# Patient Record
Sex: Female | Born: 1950
Health system: Southern US, Community
[De-identification: ages and names within clinical notes are randomized; demographics above are authoritative.]

## PROBLEM LIST (undated history)

## (undated) DIAGNOSIS — N6099 Unspecified benign mammary dysplasia of unspecified breast: Principal | ICD-10-CM

## (undated) DIAGNOSIS — S83209A Unspecified tear of unspecified meniscus, current injury, unspecified knee, initial encounter: Secondary | ICD-10-CM

## (undated) DIAGNOSIS — F32A Depression, unspecified: Secondary | ICD-10-CM

## (undated) DIAGNOSIS — Z8489 Family history of other specified conditions: Secondary | ICD-10-CM

## (undated) DIAGNOSIS — F329 Major depressive disorder, single episode, unspecified: Secondary | ICD-10-CM

## (undated) DIAGNOSIS — G4730 Sleep apnea, unspecified: Secondary | ICD-10-CM

## (undated) DIAGNOSIS — I10 Essential (primary) hypertension: Secondary | ICD-10-CM

## (undated) HISTORY — DX: Unspecified benign mammary dysplasia of unspecified breast: N60.99

## (undated) HISTORY — PX: NEVUS EXCISION: SHX2090

## (undated) HISTORY — PX: WISDOM TOOTH EXTRACTION: SHX21

## (undated) HISTORY — DX: Unspecified tear of unspecified meniscus, current injury, unspecified knee, initial encounter: S83.209A

---
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
## 1998-01-18 ENCOUNTER — Other Ambulatory Visit: Admission: RE | Admit: 1998-01-18 | Discharge: 1998-01-18 | Payer: Self-pay | Admitting: *Deleted

## 1999-04-24 ENCOUNTER — Other Ambulatory Visit: Admission: RE | Admit: 1999-04-24 | Discharge: 1999-04-24 | Payer: Self-pay | Admitting: *Deleted

## 2001-10-24 ENCOUNTER — Other Ambulatory Visit: Admission: RE | Admit: 2001-10-24 | Discharge: 2001-10-24 | Payer: Self-pay | Admitting: Family Medicine

## 2002-02-12 HISTORY — PX: FINGER SURGERY: SHX640

## 2002-06-02 ENCOUNTER — Encounter: Admission: RE | Admit: 2002-06-02 | Discharge: 2002-08-31 | Payer: Self-pay | Admitting: Family Medicine

## 2003-05-27 ENCOUNTER — Ambulatory Visit (HOSPITAL_BASED_OUTPATIENT_CLINIC_OR_DEPARTMENT_OTHER): Admission: RE | Admit: 2003-05-27 | Discharge: 2003-05-27 | Payer: Self-pay | Admitting: Orthopedic Surgery

## 2003-05-27 ENCOUNTER — Ambulatory Visit (HOSPITAL_COMMUNITY): Admission: RE | Admit: 2003-05-27 | Discharge: 2003-05-27 | Payer: Self-pay | Admitting: Orthopedic Surgery

## 2003-05-27 ENCOUNTER — Encounter (INDEPENDENT_AMBULATORY_CARE_PROVIDER_SITE_OTHER): Payer: Self-pay | Admitting: *Deleted

## 2007-10-18 ENCOUNTER — Emergency Department (HOSPITAL_BASED_OUTPATIENT_CLINIC_OR_DEPARTMENT_OTHER): Admission: EM | Admit: 2007-10-18 | Discharge: 2007-10-18 | Payer: Self-pay | Admitting: Emergency Medicine

## 2007-10-21 ENCOUNTER — Ambulatory Visit (HOSPITAL_BASED_OUTPATIENT_CLINIC_OR_DEPARTMENT_OTHER): Admission: RE | Admit: 2007-10-21 | Discharge: 2007-10-21 | Payer: Self-pay | Admitting: Emergency Medicine

## 2008-05-28 ENCOUNTER — Ambulatory Visit (HOSPITAL_COMMUNITY): Admission: RE | Admit: 2008-05-28 | Discharge: 2008-05-28 | Payer: Self-pay | Admitting: Obstetrics

## 2008-05-28 ENCOUNTER — Encounter: Payer: Self-pay | Admitting: Obstetrics

## 2008-10-31 ENCOUNTER — Ambulatory Visit: Payer: Self-pay | Admitting: Radiology

## 2008-10-31 ENCOUNTER — Emergency Department (HOSPITAL_BASED_OUTPATIENT_CLINIC_OR_DEPARTMENT_OTHER): Admission: EM | Admit: 2008-10-31 | Discharge: 2008-11-01 | Payer: Self-pay | Admitting: Emergency Medicine

## 2009-12-09 IMAGING — CT CT HEAD W/O CM
1 series · 16 of 30 positions shown, 20 images · non-contrast
Comparison: None available.

CLINICAL DATA: Dizzy.  Disoriented.  Posterior neck pain.

CT HEAD WITHOUT CONTRAST
TECHNIQUE: Contiguous axial images were obtained from the base of
the skull through the vertex without contrast.

[Series 2: head 4.8 h37s · axial · 0.46mm/px · z∈[-142,+1]mm · 16 of 32 slices shown, 20 images]
[im 2/32  brain]
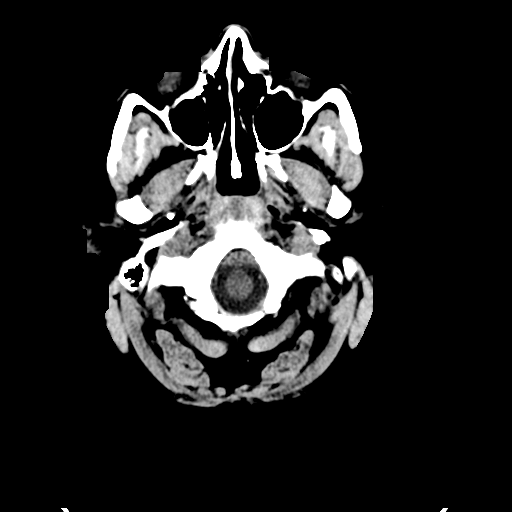
[im 2/32  bone]
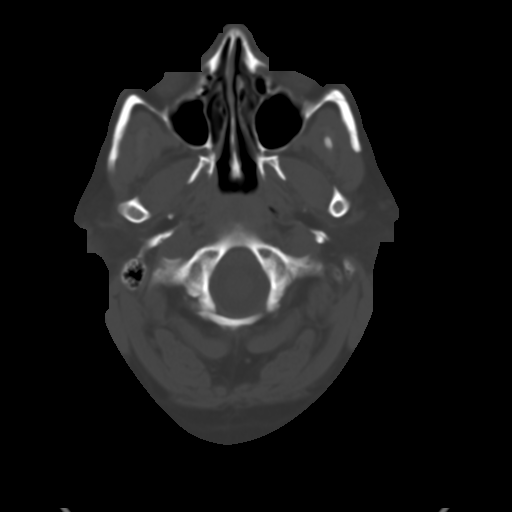
[im 4/32  brain]
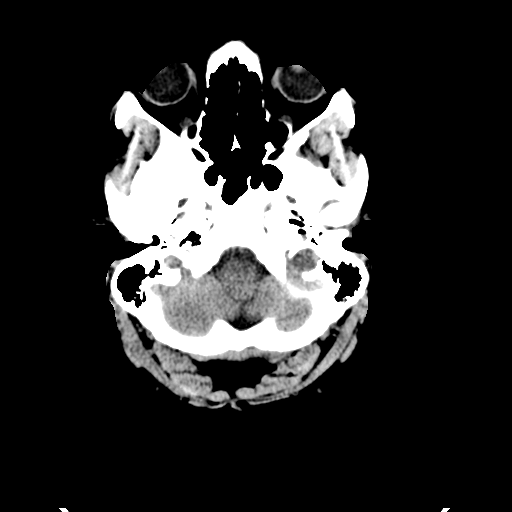
[im 6/32  brain]
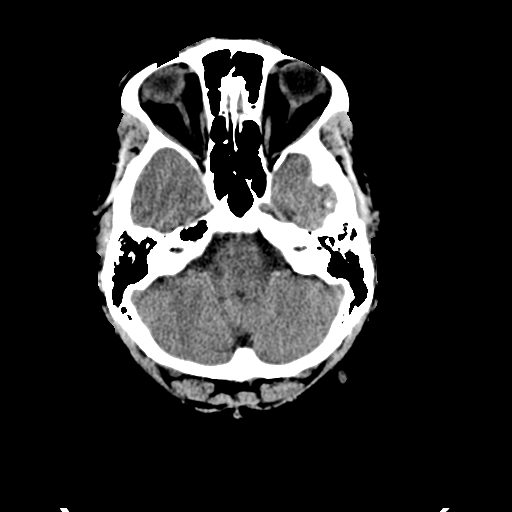
[im 8/32  brain]
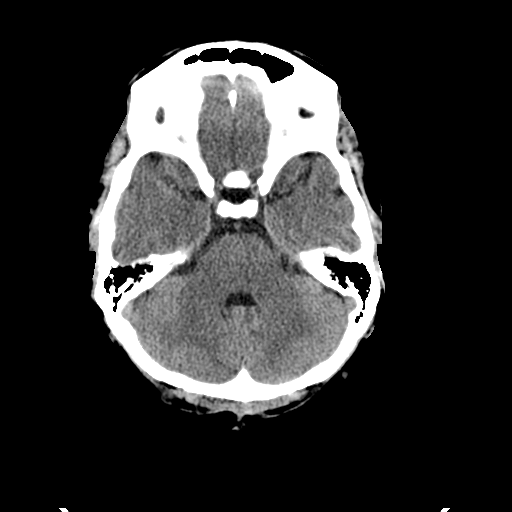
[im 9/32  brain]
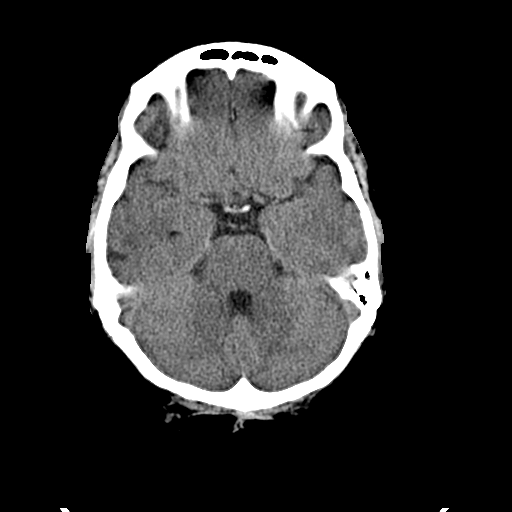
[im 9/32  bone]
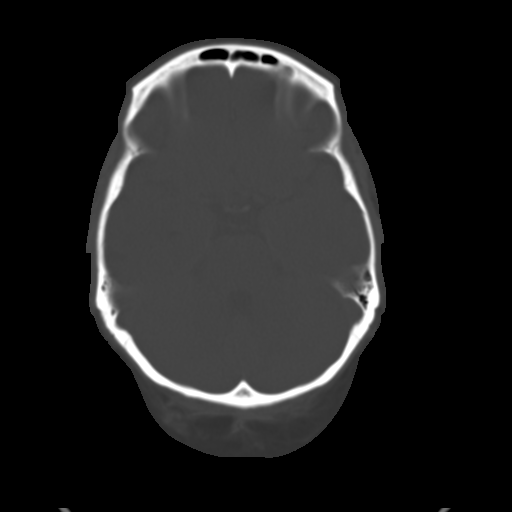
[im 11/32  brain]
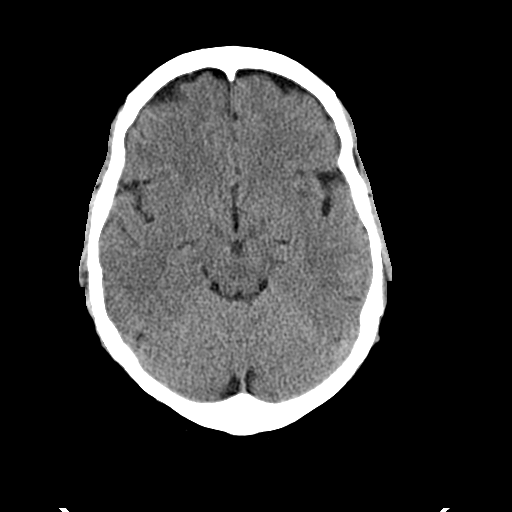
[im 13/32  brain]
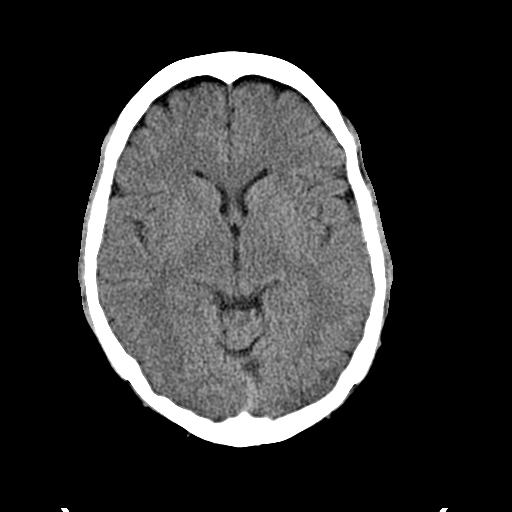
[im 15/32  brain]
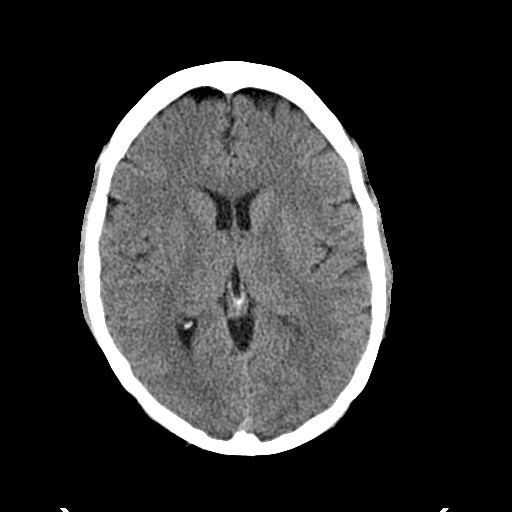
[im 17/32  brain]
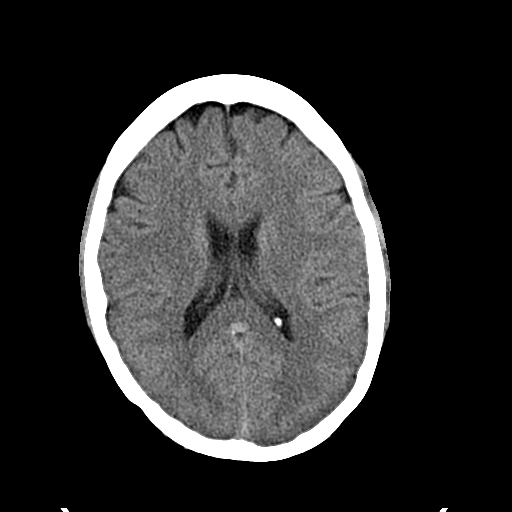
[im 17/32  bone]
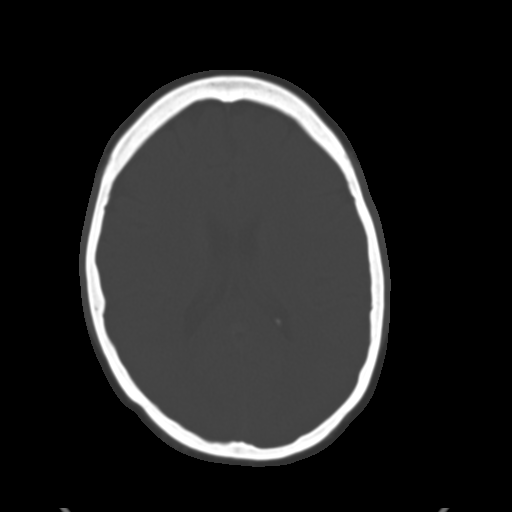
[im 19/32  brain]
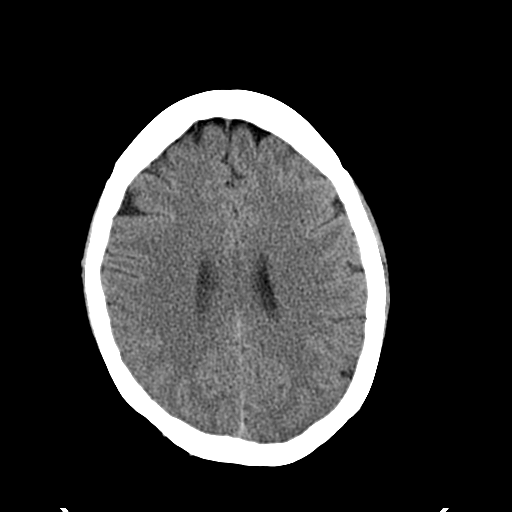
[im 21/32  brain]
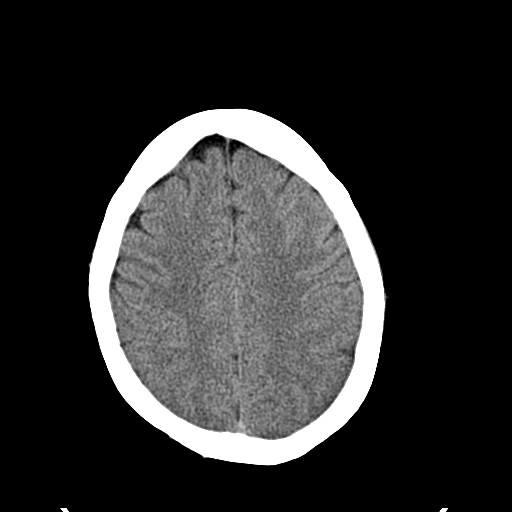
[im 23/32  brain]
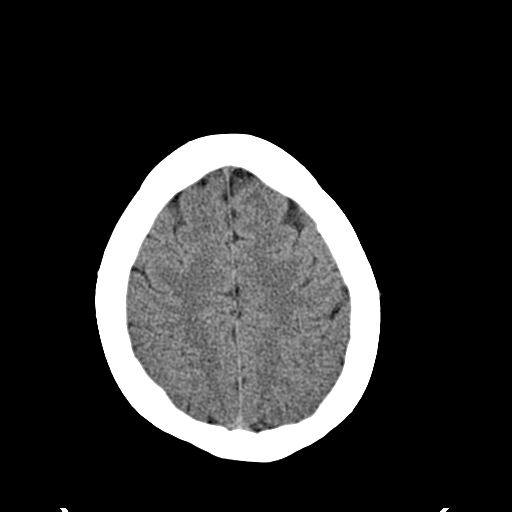
[im 24/32  brain]
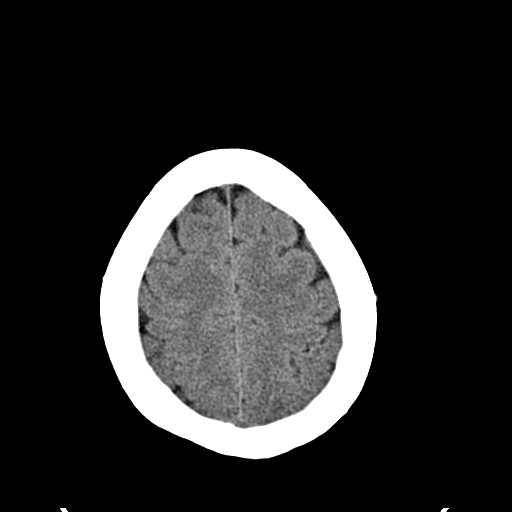
[im 24/32  bone]
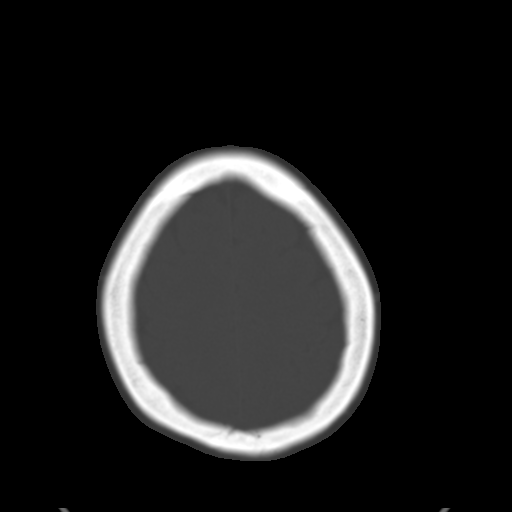
[im 26/32  brain]
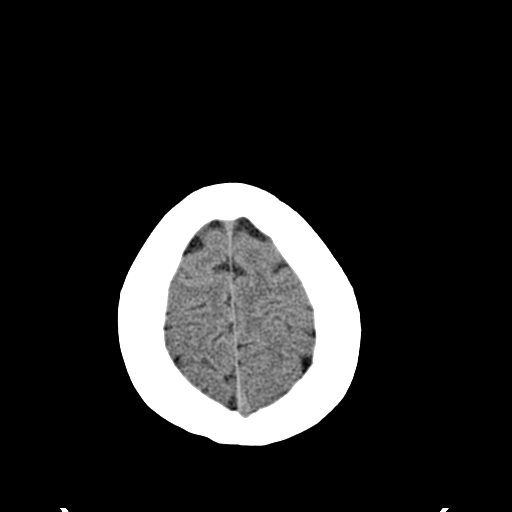
[im 28/32  brain]
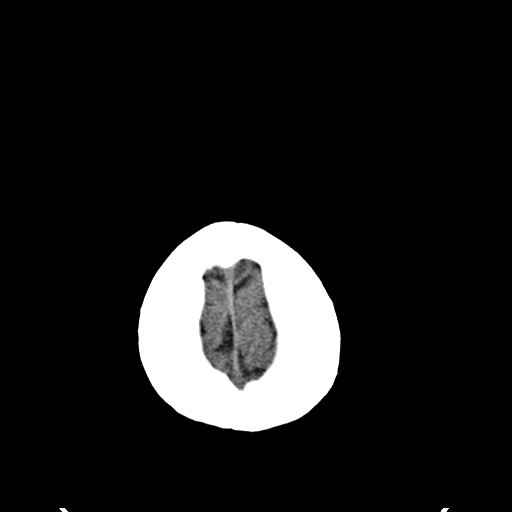
[im 30/32  brain]
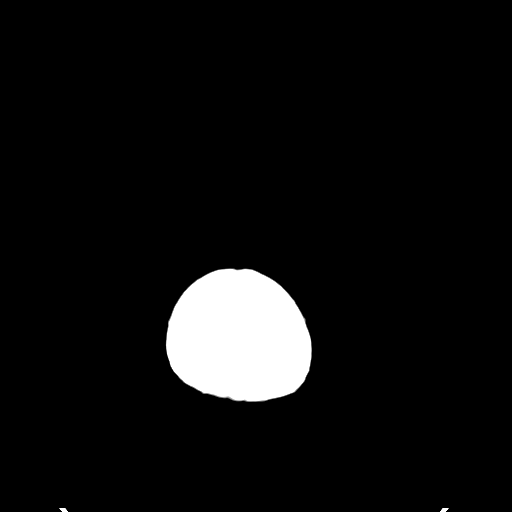

[16 of 30 positions shown; findings below may reference images not displayed]

FINDINGS: No acute intracranial abnormality is present.
Specifically, there is no evidence for acute infarct, hemorrhage,
mass, hydrocephalus, or extra-axial fluid collection.  The
paranasal sinuses and mastoid air cells are clear.  The globes and
orbits are intact.  The osseous skull is intact.
IMPRESSION: No acute intracranial abnormality.

## 2010-05-19 LAB — CBC
Hemoglobin: 13.9 g/dL (ref 12.0–15.0)
Platelets: 217 10*3/uL (ref 150–400)
RDW: 11.8 % (ref 11.5–15.5)
WBC: 5.9 10*3/uL (ref 4.0–10.5)

## 2010-05-19 LAB — URINALYSIS, ROUTINE W REFLEX MICROSCOPIC
Bilirubin Urine: NEGATIVE
Glucose, UA: NEGATIVE mg/dL
Hgb urine dipstick: NEGATIVE
Ketones, ur: 15 mg/dL — AB
Nitrite: NEGATIVE
Specific Gravity, Urine: 1.009 (ref 1.005–1.030)
pH: 5.5 (ref 5.0–8.0)

## 2010-05-19 LAB — DIFFERENTIAL
Basophils Absolute: 0 10*3/uL (ref 0.0–0.1)
Lymphocytes Relative: 15 % (ref 12–46)
Lymphs Abs: 0.9 10*3/uL (ref 0.7–4.0)
Monocytes Absolute: 0.1 10*3/uL (ref 0.1–1.0)
Neutro Abs: 4.8 10*3/uL (ref 1.7–7.7)

## 2010-05-19 LAB — POCT CARDIAC MARKERS
CKMB, poc: <1 ng/mL — ABNORMAL LOW (ref 1.0–8.0)
Troponin i, poc: <0.05 ng/mL (ref 0.00–0.09)

## 2010-05-19 LAB — URINE MICROSCOPIC-ADD ON

## 2010-05-19 LAB — BASIC METABOLIC PANEL
Calcium: 8.9 mg/dL (ref 8.4–10.5)
GFR calc non Af Amer: >60 mL/min (ref >60)
Glucose, Bld: 146 mg/dL — ABNORMAL HIGH (ref 70–99)
Sodium: 139 mEq/L (ref 135–145)

## 2010-05-24 LAB — CBC
Platelets: 249 10*3/uL (ref 150–400)
RDW: 12.5 % (ref 11.5–15.5)

## 2010-05-24 LAB — GLUCOSE, CAPILLARY

## 2010-06-27 NOTE — Op Note (Signed)
[name], Monique Reynolds              ACCOUNT NO.:  [id number]   MEDICAL RECORD NO.:  [id number]          PATIENT TYPE:  AMB   LOCATION:  SDC                           FACILITY:  [facility]   PHYSICIAN:  Charles A. Clearance Coots, M.D.DATE OF BIRTH:  June 04, 1950   DATE OF PROCEDURE:  DATE OF DISCHARGE:                               OPERATIVE REPORT   PREOPERATIVE DIAGNOSIS:  Pigmented nevus, right vulvar mons pubis area.   POSTOPERATIVE DIAGNOSIS:  Pigmented nevus, right vulvar mons pubis area.   PROCEDURE:  Excision of pigmented nevus, right vulvar mons pubis   SURGEON:  Charles A. Clearance Coots, MD   ANESTHESIA:  General.   ESTIMATED BLOOD LOSS:  Minimal.   COMPLICATIONS:  None.   FINDINGS:  Pigmented nevus, right vulvar mons pubis area.   SPECIMEN:  Demented nevus.   DISPOSITION:  Specimen to Pathology.   OPERATION:  The patient was brought to the operating room, and after  satisfactory general endotracheal anesthesia, the external genitalia was  prepped and draped in an usual sterile fashion.  An elliptical incision  was made with a 11 scalpel, and the nevus was removed and submitted to  Pathology for evaluation.  The base was then closed with subcuticular  suture of 3-0 Vicryl.  Dermabond was applied to the closure.  There was  no active bleeding at the conclusion of the procedure.  The patient  tolerated the procedure well and transported to the recovery room in  satisfactory condition.      Charles A. Clearance Coots, M.D.  Electronically Signed     CAH/MEDQ  D:  05/28/2008  T:  05/28/2008  Job:  [id number]

## 2010-06-30 NOTE — Op Note (Signed)
Monique Reynolds, Monique Reynolds                          ACCOUNT NO.:  [id number]   MEDICAL RECORD NO.:  [id number]                   PATIENT TYPE:  AMB   LOCATION:  DSC                                  FACILITY:  [facility]   PHYSICIAN:  Katy Fitch. Naaman Plummer., M.D.          DATE OF BIRTH:  Mar 20, 1950   DATE OF PROCEDURE:  05/27/2003  DATE OF DISCHARGE:                                 OPERATIVE REPORT   PREOPERATIVE DIAGNOSIS:  Painful mass right ring finger at C1-A2 pulley.   POSTOPERATIVE DIAGNOSIS:  Exuberant synovitis, rule out inflammatory  tenosynovitis versus microbacterial infection or other atypical infection of  right ring finger flexor sheath.   OPERATION:  Exploration of right ring finger flexor sheath with synovectomy  of profundus tendons for synovial biopsy and synovial culture for acid fast  bacilli, microbacteria, and routine histopathology.   SURGEON:  Katy Fitch. Sypher, M.D.   ASSISTANT:  Annye Rusk, P.A.   ANESTHESIA:  MAC.   ANESTHESIOLOGIST:  Janetta Hora. Gelene Mink, M.D.   INDICATIONS FOR PROCEDURE:  Monique Reynolds is a 60 year old referred by Dr.  Catha Gosselin for evaluation and management of a painful mass in her right  ring finger.  Our initial impression is that this is probably a retinacular  ganglion.  She reported discomfort and pain with prehension.  We observed  the mass for one month.  It did not involute nor did it enlarge  significantly.  We recommended excisional biopsy for diagnosis.  She is  brought to the operating room at this time anticipating biopsy and possible  synovectomy.   PROCEDURE:  Monique Reynolds is brought to the operating room and placed on  supine position on the operating table.  Following light sedation, the right  arm was prepped with Betadine solution and sterilely draped.  A pneumatic  tourniquet was applied to the proximal brachium.  0.25% Marcaine and 2%  lidocaine were infiltrated at the metacarpal head level to obtain a digital  block.  When anesthesia was satisfactory, the procedure commenced with a  Brunner zig-zag incision exploring the flexor sheath from the proximal  margin of A4 to the distal portion of A2.  A very exuberant hyperplastic and  highly vascular synovitis was noted extruding between the A4 pulley and C1,  C1 and A3, and C1 and A2.  A rongeur was used to gently remove the  pathologic synovium and a biopsy specimen was placed in formalin for  histopathologic evaluation.  A complete synovectomy of the superficialis and  profundus tendons was accomplished at the level of the A4 through the A2  pulley obtaining a substantial amount of synovium for evaluation for AFB  smear and microbacterial culture.  The flexor tendons were moderately  fibular and necrotic deep to the tenosynovitis suggesting an infiltrative  process.  The wound was then irrigated and repaired with interrupted suture  of 5-0 nylon.  There were no apparent complications.  A provisional  diagnosis is an inflammatory tenosynovitis, either infectious or arthritic  in nature.  We will await the biopsy results.  There were no apparent  complications.   For aftercare, she was placed in a compressive dressing.  She was given  prescriptions for Vicodin 5 mg 1-2 tablets p.o. q.4-6h. p.r.n. pain and  Doxycycline 100 mg p.o. daily x 7 days.  She will return to our office for  follow up in 5-7 days to review her culture results, biopsy results and  wound examination.                                               Katy Fitch Naaman Plummer., M.D.    RVS/MEDQ  D:  05/27/2003  T:  05/27/2003  Job:  [id number]   cc:   Caryn Bee L. Little, M.D.  [address]  [place]  [place] [address]  Fax: [phone]

## 2010-11-15 LAB — URINALYSIS, ROUTINE W REFLEX MICROSCOPIC
Glucose, UA: NEGATIVE
Hgb urine dipstick: NEGATIVE
Protein, ur: NEGATIVE

## 2010-11-15 LAB — COMPREHENSIVE METABOLIC PANEL
ALT: 30
AST: 28
Albumin: 4.2
Alkaline Phosphatase: 66
BUN: 16
GFR calc Af Amer: >60
Potassium: 4.2
Sodium: 140
Total Protein: 7.1

## 2010-11-15 LAB — DIFFERENTIAL
Basophils Relative: 0
Monocytes Absolute: 0.5
Monocytes Relative: 5
Neutro Abs: 6

## 2010-11-15 LAB — CBC
Platelets: 236
RDW: 12.3

## 2011-07-05 ENCOUNTER — Other Ambulatory Visit: Payer: Self-pay | Admitting: Obstetrics

## 2011-09-10 ENCOUNTER — Other Ambulatory Visit: Payer: Self-pay | Admitting: Radiology

## 2011-10-04 ENCOUNTER — Ambulatory Visit (INDEPENDENT_AMBULATORY_CARE_PROVIDER_SITE_OTHER): Payer: BC Managed Care – PPO | Admitting: Surgery

## 2011-10-04 ENCOUNTER — Encounter (INDEPENDENT_AMBULATORY_CARE_PROVIDER_SITE_OTHER): Payer: Self-pay | Admitting: Surgery

## 2011-10-04 VITALS — BP 114/80 | HR 92 | Temp 98.4°F | Resp 12 | Ht 62.0 in | Wt 226.8 lb

## 2011-10-04 DIAGNOSIS — N6099 Unspecified benign mammary dysplasia of unspecified breast: Secondary | ICD-10-CM

## 2011-10-04 DIAGNOSIS — N6089 Other benign mammary dysplasias of unspecified breast: Secondary | ICD-10-CM

## 2011-10-04 HISTORY — DX: Unspecified benign mammary dysplasia of unspecified breast: N60.99

## 2011-10-04 NOTE — Progress Notes (Signed)
Patient ID: Monique Reynolds, female   DOB: [birth year], 61 y.o.   MRN: [id number]  Chief Complaint  Patient presents with  . Pre-op Exam    eval rt br atypical hyperplasia    HPI Monique Reynolds is a 61 y.o. female.  Referred by Dr. Rogelia Mire for atypical ductal hyperplasia of the right breast HPI This is a 61 year old female who presents after routine screening mammogram. The patient has fibrocystic changes so self-examination is somewhat difficult. She underwent screening mammogram on 08/20/11 and was noted to have some asymmetry in the anterior right breast at 12:00. On 08/30/11 she underwent diagnostic right mammogram and right breast ultrasound. These showed a 1 cm mass located at 10:00, 2 cm from the nipple. This is mobile and well circumscribed. Posterior to this mass there is a segmental cluster of punctate and amorphous, indeterminate microcalcifications. On 09/10/11 she underwent stereotactic biopsy of the anterior 1 cm lesion, the posterior microcalcifications, as well as ultrasound-guided cyst aspiration. The anterior lesion shows atypical ductal hyperplasia with associated calcification as well as pseudo-angiomatous stromal hyperplasia. The posterior core biopsy showed benign breast parenchyma with a very small focus of fibrocystic changes. The cyst fluid was negative. She presents now to discuss surgical excision of the anterior lesion.  Past Medical History  Diagnosis Date  . Diabetes mellitus   . Atypical ductal hyperplasia of breast - right  10/04/2011  hypercholesterolemia   Past Surgical History  Procedure Date  . Tonsillectomy 1956  . Finger surgery 2004    Family History  Problem Relation Age of Onset  . Cancer Mother     breast & Ovarian & melanoma  . Heart disease Father   . Hypertension Father   . Cancer Paternal Aunt     breast  . Stroke Maternal Grandmother   . Stroke Paternal Grandmother   . Cancer Cousin     colon    Social History History    Substance Use Topics  . Smoking status: Former Smoker    Quit date: 02/12/2006  . Smokeless tobacco: Never Used  . Alcohol Use: 0.0 oz/week     socially    Allergies  Allergen Reactions  . Compazine (Prochlorperazine Edisylate)     Pt does not remember reaction    Current Outpatient Prescriptions  Medication Sig Dispense Refill  . aspirin 81 MG tablet Take 81 mg by mouth daily.      . citalopram (CELEXA) 20 MG tablet Take 20 mg by mouth daily.      Marland Kitchen co-enzyme Q-10 30 MG capsule Take 30 mg by mouth 3 (three) times daily.      Marland Kitchen estradiol-levonorgestrel (CLIMARAPRO) 0.045-0.015 MG/DAY Place 1 patch onto the skin once a week.      . fish oil-omega-3 fatty acids 1000 MG capsule Take 2 g by mouth daily.      Marland Kitchen glimepiride (AMARYL) 2 MG tablet Take 2 mg by mouth daily before breakfast.      . lisinopril (PRINIVIL,ZESTRIL) 10 MG tablet Take 10 mg by mouth daily.      . metFORMIN (GLUCOPHAGE) 500 MG tablet Take 500 mg by mouth 2 (two) times daily with a meal.      . Multiple Vitamin (MULTIVITAMIN) tablet Take 1 tablet by mouth daily.      . simvastatin (ZOCOR) 40 MG tablet Take 40 mg by mouth every evening.        Review of Systems Review of Systems  Constitutional: Negative for fever, chills and unexpected  weight change.  HENT: Negative for hearing loss, congestion, sore throat, trouble swallowing and voice change.   Eyes: Negative for visual disturbance.  Respiratory: Positive for cough and wheezing.   Cardiovascular: Negative for chest pain, palpitations and leg swelling.  Gastrointestinal: Negative for nausea, vomiting, abdominal pain, diarrhea, constipation, blood in stool, abdominal distention and anal bleeding.  Genitourinary: Negative for hematuria, vaginal bleeding and difficulty urinating.  Musculoskeletal: Negative for arthralgias.  Skin: Negative for rash and wound.  Neurological: Negative for seizures, syncope and headaches.  Hematological: Negative for adenopathy.  Does not bruise/bleed easily.  Psychiatric/Behavioral: Negative for confusion.  Menarche age 53 First pregnancy age 55 Breast-feeding yes Hormones 5 years Menopause currently having irregular periods. Last menstrual period 3 months ago.  Blood pressure 114/80, pulse 92, temperature 98.4 F (36.9 C), temperature source Temporal, resp. rate 12, height 5\' 2"  (1.575 m), weight 226 lb 12.8 oz (102.876 kg).  Physical Exam Physical Exam WDWN in NAD HEENT:  EOMI, sclera anicteric Neck:  No masses, no thyromegaly Breasts - bilateral fibrocystic changes; no dominant masses; no axillary lymphadenopathy Lungs:  CTA bilaterally; normal respiratory effort CV:  Regular rate and rhythm; no murmurs Abd:  +bowel sounds, soft, non-tender, no masses Ext:  Well-perfused; no edema Skin:  Warm, dry; no sign of jaundice  Data Reviewed See HPI  Assessment    Atypical ductal hyperplasia - 1cm right breast 10:00, 2 cm outside nipple    Plan    Right needle-localized lumpectomy.  The surgical procedure has been discussed with the patient.  Potential risks, benefits, alternative treatments, and expected outcomes have been explained.  All of the patient's questions at this time have been answered.  The likelihood of reaching the patient's treatment goal is good.  The patient understand the proposed surgical procedure and wishes to proceed.        Herberth Deharo K. 10/04/2011, 5:27 PM

## 2011-10-23 ENCOUNTER — Encounter (HOSPITAL_BASED_OUTPATIENT_CLINIC_OR_DEPARTMENT_OTHER): Payer: Self-pay | Admitting: *Deleted

## 2011-10-23 NOTE — Progress Notes (Signed)
To come in for bmet-ekg Bring all meds dos

## 2011-10-24 ENCOUNTER — Encounter (HOSPITAL_BASED_OUTPATIENT_CLINIC_OR_DEPARTMENT_OTHER)
Admission: RE | Admit: 2011-10-24 | Discharge: 2011-10-24 | Disposition: A | Discharge status: Home / Self Care | Payer: BC Managed Care – PPO | Source: Ambulatory Visit | Attending: Surgery | Admitting: Surgery

## 2011-10-24 LAB — BASIC METABOLIC PANEL
CO2: 30 mEq/L (ref 19–32)
Calcium: 10.7 mg/dL — ABNORMAL HIGH (ref 8.4–10.5)
Creatinine, Ser: 0.67 mg/dL (ref 0.50–1.10)
GFR calc non Af Amer: >90 mL/min (ref >90)
Glucose, Bld: 68 mg/dL — ABNORMAL LOW (ref 70–99)
Sodium: 139 mEq/L (ref 135–145)

## 2011-10-29 ENCOUNTER — Encounter (HOSPITAL_BASED_OUTPATIENT_CLINIC_OR_DEPARTMENT_OTHER): Payer: Self-pay | Admitting: Certified Registered Nurse Anesthetist

## 2011-10-29 ENCOUNTER — Ambulatory Visit (HOSPITAL_BASED_OUTPATIENT_CLINIC_OR_DEPARTMENT_OTHER): Payer: BC Managed Care – PPO | Admitting: Certified Registered Nurse Anesthetist

## 2011-10-29 ENCOUNTER — Ambulatory Visit (HOSPITAL_BASED_OUTPATIENT_CLINIC_OR_DEPARTMENT_OTHER)
Admission: RE | Admit: 2011-10-29 | Discharge: 2011-10-29 | Disposition: A | Discharge status: Home / Self Care | Payer: BC Managed Care – PPO | Source: Ambulatory Visit | Attending: Surgery | Admitting: Surgery

## 2011-10-29 ENCOUNTER — Encounter (HOSPITAL_BASED_OUTPATIENT_CLINIC_OR_DEPARTMENT_OTHER): Payer: Self-pay

## 2011-10-29 ENCOUNTER — Encounter (HOSPITAL_BASED_OUTPATIENT_CLINIC_OR_DEPARTMENT_OTHER)
Admission: RE | Disposition: A | Discharge status: Home / Self Care | Payer: Self-pay | Source: Ambulatory Visit | Attending: Surgery

## 2011-10-29 DIAGNOSIS — N6089 Other benign mammary dysplasias of unspecified breast: Secondary | ICD-10-CM | POA: Insufficient documentation

## 2011-10-29 DIAGNOSIS — N6099 Unspecified benign mammary dysplasia of unspecified breast: Secondary | ICD-10-CM

## 2011-10-29 DIAGNOSIS — E119 Type 2 diabetes mellitus without complications: Secondary | ICD-10-CM | POA: Insufficient documentation

## 2011-10-29 DIAGNOSIS — D249 Benign neoplasm of unspecified breast: Secondary | ICD-10-CM

## 2011-10-29 DIAGNOSIS — I10 Essential (primary) hypertension: Secondary | ICD-10-CM | POA: Insufficient documentation

## 2011-10-29 DIAGNOSIS — N6019 Diffuse cystic mastopathy of unspecified breast: Secondary | ICD-10-CM

## 2011-10-29 HISTORY — DX: Depression, unspecified: F32.A

## 2011-10-29 HISTORY — DX: Family history of other specified conditions: Z84.89

## 2011-10-29 HISTORY — DX: Essential (primary) hypertension: I10

## 2011-10-29 HISTORY — PX: BREAST SURGERY: SHX581

## 2011-10-29 HISTORY — DX: Sleep apnea, unspecified: G47.30

## 2011-10-29 HISTORY — DX: Major depressive disorder, single episode, unspecified: F32.9

## 2011-10-29 LAB — GLUCOSE, CAPILLARY: Glucose-Capillary: 113 mg/dL — ABNORMAL HIGH (ref 70–99)

## 2011-10-29 LAB — POCT HEMOGLOBIN-HEMACUE: Hemoglobin: 12.7 g/dL (ref 12.0–15.0)

## 2011-10-29 SURGERY — BREAST LUMPECTOMY WITH NEEDLE LOCALIZATION
Anesthesia: General | Site: Breast | Laterality: Right | Wound class: Clean

## 2011-10-29 MED ORDER — LIDOCAINE HCL (CARDIAC) 20 MG/ML IV SOLN
INTRAVENOUS | Status: DC | PRN
  Administered 2011-10-29: 60 mg via INTRAVENOUS

## 2011-10-29 MED ORDER — BUPIVACAINE-EPINEPHRINE 0.25% -1:200000 IJ SOLN
INTRAMUSCULAR | Status: DC | PRN
  Administered 2011-10-29: 6.5 mL

## 2011-10-29 MED ORDER — OXYCODONE HCL 5 MG PO TABS
5.0000 mg | ORAL_TABLET | Freq: Once | ORAL | Status: AC | PRN
  Administered 2011-10-29: 5 mg via ORAL

## 2011-10-29 MED ORDER — GLYCOPYRROLATE 0.2 MG/ML IJ SOLN
INTRAMUSCULAR | Status: DC | PRN
  Administered 2011-10-29: 0.2 mg via INTRAVENOUS

## 2011-10-29 MED ORDER — MORPHINE SULFATE 2 MG/ML IJ SOLN: 2.0000 mg | INTRAMUSCULAR | Status: DC | PRN

## 2011-10-29 MED ORDER — HYDROCODONE-ACETAMINOPHEN 5-325 MG PO TABS: 1.0000 | ORAL_TABLET | ORAL | Status: DC | PRN

## 2011-10-29 MED ORDER — OXYCODONE HCL 5 MG/5ML PO SOLN: 5.0000 mg | Freq: Once | ORAL | Status: AC | PRN

## 2011-10-29 MED ORDER — EPHEDRINE SULFATE 50 MG/ML IJ SOLN
INTRAMUSCULAR | Status: DC | PRN
  Administered 2011-10-29 (×2): 10 mg via INTRAVENOUS

## 2011-10-29 MED ORDER — ONDANSETRON HCL 4 MG/2ML IJ SOLN
INTRAMUSCULAR | Status: DC | PRN
  Administered 2011-10-29: 4 mg via INTRAVENOUS

## 2011-10-29 MED ORDER — MEPERIDINE HCL 25 MG/ML IJ SOLN: 6.2500 mg | INTRAMUSCULAR | Status: DC | PRN

## 2011-10-29 MED ORDER — ONDANSETRON HCL 4 MG/2ML IJ SOLN: 4.0000 mg | INTRAMUSCULAR | Status: DC | PRN

## 2011-10-29 MED ORDER — PROMETHAZINE HCL 25 MG/ML IJ SOLN: 6.2500 mg | INTRAMUSCULAR | Status: DC | PRN

## 2011-10-29 MED ORDER — MIDAZOLAM HCL 5 MG/5ML IJ SOLN
INTRAMUSCULAR | Status: DC | PRN
  Administered 2011-10-29: 2 mg via INTRAVENOUS

## 2011-10-29 MED ORDER — FENTANYL CITRATE 0.05 MG/ML IJ SOLN
INTRAMUSCULAR | Status: DC | PRN
  Administered 2011-10-29: 25 ug via INTRAVENOUS
  Administered 2011-10-29 (×2): 50 ug via INTRAVENOUS

## 2011-10-29 MED ORDER — DEXTROSE 5 % IV SOLN
3.0000 g | INTRAVENOUS | Status: AC
  Administered 2011-10-29: 3 g via INTRAVENOUS

## 2011-10-29 MED ORDER — HYDROMORPHONE HCL PF 1 MG/ML IJ SOLN: 0.2500 mg | INTRAMUSCULAR | Status: DC | PRN

## 2011-10-29 MED ORDER — LACTATED RINGERS IV SOLN
INTRAVENOUS | Status: DC
  Administered 2011-10-29 (×2): via INTRAVENOUS

## 2011-10-29 MED ORDER — PROPOFOL 10 MG/ML IV BOLUS
INTRAVENOUS | Status: DC | PRN
  Administered 2011-10-29: 200 mg via INTRAVENOUS

## 2011-10-29 MED ORDER — DEXAMETHASONE SODIUM PHOSPHATE 4 MG/ML IJ SOLN
INTRAMUSCULAR | Status: DC | PRN
  Administered 2011-10-29: 4 mg via INTRAVENOUS

## 2011-10-29 SURGICAL SUPPLY — 41 items
APPLICATOR COTTON TIP 6IN STRL (MISCELLANEOUS) IMPLANT
BENZOIN TINCTURE PRP APPL 2/3 (GAUZE/BANDAGES/DRESSINGS) ×2 IMPLANT
BLADE HEX COATED 2.75 (ELECTRODE) ×2 IMPLANT
BLADE SURG 15 STRL LF DISP TIS (BLADE) ×1 IMPLANT
BLADE SURG 15 STRL SS (BLADE) ×1
CANISTER SUCTION 1200CC (MISCELLANEOUS) IMPLANT
CHLORAPREP W/TINT 26ML (MISCELLANEOUS) ×2 IMPLANT
CLOTH BEACON ORANGE TIMEOUT ST (SAFETY) ×2 IMPLANT
COVER MAYO STAND STRL (DRAPES) ×2 IMPLANT
COVER TABLE BACK 60X90 (DRAPES) ×2 IMPLANT
DECANTER SPIKE VIAL GLASS SM (MISCELLANEOUS) IMPLANT
DEVICE DUBIN W/COMP PLATE 8390 (MISCELLANEOUS) IMPLANT
DRAPE PED LAPAROTOMY (DRAPES) ×2 IMPLANT
DRAPE UTILITY XL STRL (DRAPES) ×2 IMPLANT
DRSG TEGADERM 4X4.75 (GAUZE/BANDAGES/DRESSINGS) ×2 IMPLANT
ELECT REM PT RETURN 9FT ADLT (ELECTROSURGICAL) ×2
ELECTRODE REM PT RTRN 9FT ADLT (ELECTROSURGICAL) ×1 IMPLANT
GAUZE SPONGE 4X4 12PLY STRL LF (GAUZE/BANDAGES/DRESSINGS) ×2 IMPLANT
GLOVE BIO SURGEON STRL SZ7 (GLOVE) ×4 IMPLANT
GLOVE BIOGEL PI IND STRL 7.5 (GLOVE) ×1 IMPLANT
GLOVE BIOGEL PI INDICATOR 7.5 (GLOVE) ×1
GOWN PREVENTION PLUS XLARGE (GOWN DISPOSABLE) ×4 IMPLANT
KIT MARKER MARGIN INK (KITS) IMPLANT
NEEDLE HYPO 25X1 1.5 SAFETY (NEEDLE) ×2 IMPLANT
NS IRRIG 1000ML POUR BTL (IV SOLUTION) ×2 IMPLANT
PACK BASIN DAY SURGERY FS (CUSTOM PROCEDURE TRAY) ×2 IMPLANT
PENCIL BUTTON HOLSTER BLD 10FT (ELECTRODE) ×2 IMPLANT
SLEEVE SCD COMPRESS KNEE MED (MISCELLANEOUS) ×2 IMPLANT
SPONGE LAP 4X18 X RAY DECT (DISPOSABLE) ×2 IMPLANT
STRIP CLOSURE SKIN 1/2X4 (GAUZE/BANDAGES/DRESSINGS) ×2 IMPLANT
SUT CHROMIC 3 0 SH 27 (SUTURE) IMPLANT
SUT MON AB 4-0 PC3 18 (SUTURE) ×2 IMPLANT
SUT SILK 2 0 SH (SUTURE) IMPLANT
SUT VIC AB 3-0 SH 27 (SUTURE) ×1
SUT VIC AB 3-0 SH 27X BRD (SUTURE) ×1 IMPLANT
SYR CONTROL 10ML LL (SYRINGE) ×2 IMPLANT
TOWEL OR 17X24 6PK STRL BLUE (TOWEL DISPOSABLE) ×2 IMPLANT
TOWEL OR NON WOVEN STRL DISP B (DISPOSABLE) ×2 IMPLANT
TUBE CONNECTING 20X1/4 (TUBING) IMPLANT
WATER STERILE IRR 1000ML POUR (IV SOLUTION) IMPLANT
YANKAUER SUCT BULB TIP NO VENT (SUCTIONS) IMPLANT

## 2011-10-29 NOTE — Anesthesia Preprocedure Evaluation (Signed)
Anesthesia Evaluation  Patient identified by MRN, date of birth, ID band Patient awake    Reviewed: Allergy & Precautions, H&P , NPO status , Patient's Chart, lab work & pertinent test results  History of Anesthesia Complications (+) Family history of anesthesia reaction  Airway Mallampati: II  Neck ROM: Full    Dental   Pulmonary sleep apnea ,  breath sounds clear to auscultation        Cardiovascular hypertension, Rhythm:Regular Rate:Normal     Neuro/Psych Depression    GI/Hepatic negative GI ROS, Neg liver ROS,   Endo/Other  diabetes, Well Controlled, Oral Hypoglycemic Agents  Renal/GU      Musculoskeletal negative musculoskeletal ROS (+)   Abdominal (+) + obese,   Peds  Hematology negative hematology ROS (+)   Anesthesia Other Findings   Reproductive/Obstetrics                           Anesthesia Physical Anesthesia Plan  ASA: III  Anesthesia Plan: General   Post-op Pain Management:    Induction: Intravenous  Airway Management Planned: LMA  Additional Equipment:   Intra-op Plan:   Post-operative Plan: Extubation in OR  Informed Consent: I have reviewed the patients History and Physical, chart, labs and discussed the procedure including the risks, benefits and alternatives for the proposed anesthesia with the patient or authorized representative who has indicated his/her understanding and acceptance.   Dental advisory given  Plan Discussed with: CRNA and Surgeon  Anesthesia Plan Comments:         Anesthesia Quick Evaluation

## 2011-10-29 NOTE — Interval H&P Note (Signed)
History and Physical Interval Note:  10/29/2011 11:00 AM  Monique Reynolds  has presented today for surgery, with the diagnosis of Atypical ductal hyperplasia - right breast  The various methods of treatment have been discussed with the patient and family. After consideration of risks, benefits and other options for treatment, the patient has consented to  Procedure(s) (LRB) with comments: BREAST LUMPECTOMY WITH NEEDLE LOCALIZATION (Right) - right needle localized lumpectomy as a surgical intervention .  The patient's history has been reviewed, patient examined, no change in status, stable for surgery.  I have reviewed the patient's chart and labs.  Questions were answered to the patient's satisfaction.     Jewell Haught K.

## 2011-10-29 NOTE — Op Note (Signed)
Preop diagnosis:Atypical ductal hyperplasia right breast Postop diagnosis: Same Procedure performed: Right needle localized lumpectomy Surgeon:Neilani Duffee K. Anesthesia: Gen. Via LMA Indications: This is a 61 year old female with fibrocystic changes who was found to have some asymmetry in the anterior right breast. She underwent biopsy of this area which showed atypical ductal hyperplasia with associated PASH. She presents now for needle localized lumpectomy.  Description of procedure: The patient was brought to the operating room and placed in a supine position on the operating room table. After an adequate level of general anesthesia was obtained the patient's right breast was prepped with chlor prep and draped in sterile fashion. A timeout was taken to ensure the proper patient proper procedure. We infiltrated the area around the wire with quarter percent Marcaine with epinephrine. I made an elliptical incision here. We raised a cylinder of tissue around the wire. We dissected down for a distance of about 5-1/2 cm.The specimen was removed in its entirety and oriented with a paint kit. This was sent for pathologic examination. The wound was irrigated and inspected for hemostasis.We closed with a deep layer of 3-0 Vicryl and a subcuticular layer of 4-0 Monocryl. Steri-Strips and clean dressings were applied. The patient was extubated and brought to recovery in stable condition. All sponge, instrument, and needle counts are correct.  Wilmon Arms. Corliss Skains, MD, [facility]  10/29/2011 1:03 PM

## 2011-10-29 NOTE — Anesthesia Procedure Notes (Signed)
Procedure Name: LMA Insertion Date/Time: 10/29/2011 12:08 PM Performed by: Charnee Turnipseed D Pre-anesthesia Checklist: Patient identified, Emergency Drugs available, Suction available and Patient being monitored Patient Re-evaluated:Patient Re-evaluated prior to inductionOxygen Delivery Method: Circle System Utilized Preoxygenation: Pre-oxygenation with 100% oxygen Intubation Type: IV induction Ventilation: Mask ventilation without difficulty LMA: LMA inserted LMA Size: 4.0 Number of attempts: 1 Airway Equipment and Method: bite block Placement Confirmation: positive ETCO2 Tube secured with: Tape Dental Injury: Teeth and Oropharynx as per pre-operative assessment

## 2011-10-29 NOTE — Anesthesia Postprocedure Evaluation (Signed)
  Anesthesia Post-op Note  Patient: Monique Reynolds  Procedure(s) Performed: Procedure(s) (LRB) with comments: BREAST LUMPECTOMY WITH NEEDLE LOCALIZATION (Right) - right needle localized lumpectomy  Patient Location: PACU  Anesthesia Type: General  Level of Consciousness: awake  Airway and Oxygen Therapy: Patient Spontanous Breathing  Post-op Pain: mild  Post-op Assessment: Post-op Vital signs reviewed  Post-op Vital Signs: stable  Complications: No apparent anesthesia complications

## 2011-10-29 NOTE — Transfer of Care (Signed)
Immediate Anesthesia Transfer of Care Note  Patient: Monique Reynolds  Procedure(s) Performed: Procedure(s) (LRB) with comments: BREAST LUMPECTOMY WITH NEEDLE LOCALIZATION (Right) - right needle localized lumpectomy  Patient Location: PACU  Anesthesia Type: General  Level of Consciousness: awake, alert , oriented and patient cooperative  Airway & Oxygen Therapy: Patient Spontanous Breathing and Patient connected to face mask oxygen  Post-op Assessment: Report given to PACU RN and Post -op Vital signs reviewed and stable  Post vital signs: Reviewed and stable  Complications: No apparent anesthesia complications

## 2011-10-29 NOTE — H&P (View-Only) (Signed)
Patient ID: Monique Reynolds, female   DOB: 01/16/1951, 60 y.o.   MRN: [id number]  Chief Complaint  Patient presents with  . Pre-op Exam    eval rt br atypical hyperplasia    HPI Monique Reynolds is a 60 y.o. female.  Referred by Dr. Rick Cornella for atypical ductal hyperplasia of the right breast HPI This is a 60-year-old female who presents after routine screening mammogram. The patient has fibrocystic changes so self-examination is somewhat difficult. She underwent screening mammogram on 08/20/11 and was noted to have some asymmetry in the anterior right breast at 12:00. On 08/30/11 she underwent diagnostic right mammogram and right breast ultrasound. These showed a 1 cm mass located at 10:00, 2 cm from the nipple. This is mobile and well circumscribed. Posterior to this mass there is a segmental cluster of punctate and amorphous, indeterminate microcalcifications. On 09/10/11 she underwent stereotactic biopsy of the anterior 1 cm lesion, the posterior microcalcifications, as well as ultrasound-guided cyst aspiration. The anterior lesion shows atypical ductal hyperplasia with associated calcification as well as pseudo-angiomatous stromal hyperplasia. The posterior core biopsy showed benign breast parenchyma with a very small focus of fibrocystic changes. The cyst fluid was negative. She presents now to discuss surgical excision of the anterior lesion.  Past Medical History  Diagnosis Date  . Diabetes mellitus   . Atypical ductal hyperplasia of breast - right  10/04/2011  hypercholesterolemia   Past Surgical History  Procedure Date  . Tonsillectomy 1956  . Finger surgery 2004    Family History  Problem Relation Age of Onset  . Cancer Mother     breast & Ovarian & melanoma  . Heart disease Father   . Hypertension Father   . Cancer Paternal Aunt     breast  . Stroke Maternal Grandmother   . Stroke Paternal Grandmother   . Cancer Cousin     colon    Social History History    Substance Use Topics  . Smoking status: Former Smoker    Quit date: 02/12/2006  . Smokeless tobacco: Never Used  . Alcohol Use: 0.0 oz/week     socially    Allergies  Allergen Reactions  . Compazine (Prochlorperazine Edisylate)     Pt does not remember reaction    Current Outpatient Prescriptions  Medication Sig Dispense Refill  . aspirin 81 MG tablet Take 81 mg by mouth daily.      . citalopram (CELEXA) 20 MG tablet Take 20 mg by mouth daily.      . co-enzyme Q-10 30 MG capsule Take 30 mg by mouth 3 (three) times daily.      . estradiol-levonorgestrel (CLIMARAPRO) 0.045-0.015 MG/DAY Place 1 patch onto the skin once a week.      . fish oil-omega-3 fatty acids 1000 MG capsule Take 2 g by mouth daily.      . glimepiride (AMARYL) 2 MG tablet Take 2 mg by mouth daily before breakfast.      . lisinopril (PRINIVIL,ZESTRIL) 10 MG tablet Take 10 mg by mouth daily.      . metFORMIN (GLUCOPHAGE) 500 MG tablet Take 500 mg by mouth 2 (two) times daily with a meal.      . Multiple Vitamin (MULTIVITAMIN) tablet Take 1 tablet by mouth daily.      . simvastatin (ZOCOR) 40 MG tablet Take 40 mg by mouth every evening.        Review of Systems Review of Systems  Constitutional: Negative for fever, chills and unexpected   weight change.  HENT: Negative for hearing loss, congestion, sore throat, trouble swallowing and voice change.   Eyes: Negative for visual disturbance.  Respiratory: Positive for cough and wheezing.   Cardiovascular: Negative for chest pain, palpitations and leg swelling.  Gastrointestinal: Negative for nausea, vomiting, abdominal pain, diarrhea, constipation, blood in stool, abdominal distention and anal bleeding.  Genitourinary: Negative for hematuria, vaginal bleeding and difficulty urinating.  Musculoskeletal: Negative for arthralgias.  Skin: Negative for rash and wound.  Neurological: Negative for seizures, syncope and headaches.  Hematological: Negative for adenopathy.  Does not bruise/bleed easily.  Psychiatric/Behavioral: Negative for confusion.  Menarche age 15 First pregnancy age 23 Breast-feeding yes Hormones 5 years Menopause currently having irregular periods. Last menstrual period 3 months ago.  Blood pressure 114/80, pulse 92, temperature 98.4 F (36.9 C), temperature source Temporal, resp. rate 12, height 5' 2" (1.575 m), weight 226 lb 12.8 oz (102.876 kg).  Physical Exam Physical Exam WDWN in NAD HEENT:  EOMI, sclera anicteric Neck:  No masses, no thyromegaly Breasts - bilateral fibrocystic changes; no dominant masses; no axillary lymphadenopathy Lungs:  CTA bilaterally; normal respiratory effort CV:  Regular rate and rhythm; no murmurs Abd:  +bowel sounds, soft, non-tender, no masses Ext:  Well-perfused; no edema Skin:  Warm, dry; no sign of jaundice  Data Reviewed See HPI  Assessment    Atypical ductal hyperplasia - 1cm right breast 10:00, 2 cm outside nipple    Plan    Right needle-localized lumpectomy.  The surgical procedure has been discussed with the patient.  Potential risks, benefits, alternative treatments, and expected outcomes have been explained.  All of the patient's questions at this time have been answered.  The likelihood of reaching the patient's treatment goal is good.  The patient understand the proposed surgical procedure and wishes to proceed.        Chanette Demo K. 10/04/2011, 5:27 PM    

## 2011-10-31 NOTE — Progress Notes (Signed)
Quick Note:  Please call the patient and let them know that their path showed no sign of cancer.  ______

## 2011-11-01 ENCOUNTER — Encounter (INDEPENDENT_AMBULATORY_CARE_PROVIDER_SITE_OTHER): Payer: Self-pay | Admitting: General Surgery

## 2011-11-01 ENCOUNTER — Ambulatory Visit (INDEPENDENT_AMBULATORY_CARE_PROVIDER_SITE_OTHER): Payer: BC Managed Care – PPO | Admitting: General Surgery

## 2011-11-01 VITALS — BP 130/82 | HR 60 | Temp 97.6°F | Resp 16 | Ht 62.0 in | Wt 228.8 lb

## 2011-11-01 DIAGNOSIS — Z09 Encounter for follow-up examination after completed treatment for conditions other than malignant neoplasm: Secondary | ICD-10-CM

## 2011-11-01 NOTE — Progress Notes (Signed)
Subjective:     Patient ID: Monique Reynolds, female   DOB: Jul 18, 1950, 61 y.o.   MRN: [id number]  HPI 61 yo WF s/p Right breast needle lumpectomy by Dr Corliss Skains 3 days ago comes in b/c of concerns of hearing fluid in breast and tenderness- worried it's infected. No f/c/n/v  Review of Systems     Objective:   Physical Exam BP 130/82  Pulse 60  Temp 97.6 F (36.4 C) (Temporal)  Resp 16  Ht 5\' 2"  (1.575 m)  Wt 228 lb 12.8 oz (103.783 kg)  BMI 41.85 kg/m2 Alert, nad Right breast -surgical dressing still in place and removed. No cellulitis, induration. Incision c/d/i. Has some mild skin irritation along superior edge where tegaderm was located    Assessment:     Normal postop lumpectomy incision    Plan:     Reassured pt. Explained seromas. No evidence of infection. Keep appt with your surgeon  Mary Sella. Andrey Campanile, MD, FACS General, Bariatric, & Minimally Invasive Surgery [facility], [place]

## 2011-11-01 NOTE — Patient Instructions (Signed)
Keep appt with your surgeon. Call if area becomes red or more tender. Some swelling is expected

## 2011-11-07 ENCOUNTER — Encounter (INDEPENDENT_AMBULATORY_CARE_PROVIDER_SITE_OTHER): Payer: Self-pay

## 2011-11-15 ENCOUNTER — Encounter (INDEPENDENT_AMBULATORY_CARE_PROVIDER_SITE_OTHER): Payer: BC Managed Care – PPO | Admitting: Surgery

## 2011-11-27 ENCOUNTER — Encounter (INDEPENDENT_AMBULATORY_CARE_PROVIDER_SITE_OTHER): Payer: Self-pay | Admitting: Surgery

## 2011-11-27 ENCOUNTER — Ambulatory Visit (INDEPENDENT_AMBULATORY_CARE_PROVIDER_SITE_OTHER): Payer: BC Managed Care – PPO | Admitting: Surgery

## 2011-11-27 VITALS — BP 116/66 | HR 83 | Temp 98.2°F | Ht 62.0 in | Wt 228.6 lb

## 2011-11-27 DIAGNOSIS — N6099 Unspecified benign mammary dysplasia of unspecified breast: Secondary | ICD-10-CM

## 2011-11-27 DIAGNOSIS — N6089 Other benign mammary dysplasias of unspecified breast: Secondary | ICD-10-CM

## 2011-11-27 NOTE — Progress Notes (Signed)
Status post right needle localized lumpectomy on 10/29/11. The pathology showed only benign fibrocystic changes with no atypia or malignancy. Her incision is well-healed. No significant seroma. No sign of inflammation or infection. He does have some firm scar tissue underneath her incision. I assured her that this would slowly soften up over time. She should continue with annual mammograms and monthly breast self-examinations. Followup as needed.  Wilmon Arms. Corliss Skains, MD, [facility]  11/27/2011 11:02 AM

## 2012-04-23 ENCOUNTER — Other Ambulatory Visit: Payer: Self-pay | Admitting: Obstetrics

## 2012-04-23 DIAGNOSIS — Z1231 Encounter for screening mammogram for malignant neoplasm of breast: Secondary | ICD-10-CM

## 2012-04-29 ENCOUNTER — Ambulatory Visit (HOSPITAL_COMMUNITY)
Admission: RE | Admit: 2012-04-29 | Discharge: 2012-04-29 | Disposition: A | Discharge status: Home / Self Care | Payer: 59 | Source: Ambulatory Visit | Attending: Obstetrics | Admitting: Obstetrics

## 2012-04-29 DIAGNOSIS — Z1231 Encounter for screening mammogram for malignant neoplasm of breast: Secondary | ICD-10-CM | POA: Insufficient documentation

## 2012-05-14 ENCOUNTER — Encounter (INDEPENDENT_AMBULATORY_CARE_PROVIDER_SITE_OTHER): Payer: Self-pay

## 2013-03-31 ENCOUNTER — Ambulatory Visit: Payer: Self-pay | Admitting: Obstetrics

## 2013-04-14 ENCOUNTER — Encounter: Payer: Self-pay | Admitting: Obstetrics

## 2013-04-14 ENCOUNTER — Ambulatory Visit (INDEPENDENT_AMBULATORY_CARE_PROVIDER_SITE_OTHER): Payer: No Typology Code available for payment source | Admitting: Obstetrics

## 2013-04-14 ENCOUNTER — Ambulatory Visit: Payer: Self-pay | Admitting: Obstetrics

## 2013-04-14 VITALS — BP 139/72 | HR 80 | Temp 97.6°F | Ht 62.0 in | Wt 227.0 lb

## 2013-04-14 DIAGNOSIS — N951 Menopausal and female climacteric states: Secondary | ICD-10-CM | POA: Insufficient documentation

## 2013-04-14 DIAGNOSIS — Z124 Encounter for screening for malignant neoplasm of cervix: Secondary | ICD-10-CM

## 2013-04-14 DIAGNOSIS — Z01419 Encounter for gynecological examination (general) (routine) without abnormal findings: Secondary | ICD-10-CM

## 2013-04-14 DIAGNOSIS — N760 Acute vaginitis: Secondary | ICD-10-CM

## 2013-04-14 MED ORDER — ESTRADIOL-LEVONORGESTREL 0.045-0.015 MG/DAY TD PTWK: 1.0000 | MEDICATED_PATCH | TRANSDERMAL | Status: DC

## 2013-04-14 NOTE — Progress Notes (Signed)
Subjective:     Monique Reynolds is a 63 y.o. female here for a routine exam.  Current complaints: Patient is in the office today for an Annual exam. Patient states she would like to talk about a referrial for a bone density scan. Patient states she would like a refill on her Climarapro that she has one patch left. Personal health questionnaire reviewed: yes.   Gynecologic History No LMP recorded. Patient is postmenopausal. Contraception: post menopausal status Last Pap: 07/10/2011. Results were: normal Last mammogram: 2014. Results were: normal  Obstetric History OB History  No data available     The following portions of the patient's history were reviewed and updated as appropriate: allergies, current medications, past family history, past medical history, past social history, past surgical history and problem list.  Review of Systems Pertinent items are noted in HPI.    Objective:    General appearance: alert and no distress Breasts: normal appearance, no masses or tenderness Abdomen: normal findings: soft, non-tender Pelvic: cervix normal in appearance, external genitalia normal, no adnexal masses or tenderness, no cervical motion tenderness, rectovaginal septum normal, uterus normal size, shape, and consistency and vagina normal without discharge    Assessment:    Healthy female exam.    Plan:    Education reviewed: calcium supplements, low fat, low cholesterol diet, self breast exams and weight bearing exercise. Hormone replacement therapy: hormone replacement therapy: Climara Pro, risks and benefits reviewed, prescription for 12 months and follow up appointment recommended. Mammogram ordered. Follow up in: 1 year. Bone Density Study ordered.

## 2013-04-15 LAB — GC/CHLAMYDIA PROBE AMP
CT PROBE, AMP APTIMA: NEGATIVE
GC Probe RNA: NEGATIVE

## 2013-04-15 LAB — WET PREP BY MOLECULAR PROBE
Candida species: POSITIVE — AB
GARDNERELLA VAGINALIS: NEGATIVE
Trichomonas vaginosis: NEGATIVE

## 2013-04-16 LAB — PAP IG W/ RFLX HPV ASCU

## 2013-04-17 ENCOUNTER — Other Ambulatory Visit: Payer: Self-pay | Admitting: *Deleted

## 2013-04-17 DIAGNOSIS — B379 Candidiasis, unspecified: Secondary | ICD-10-CM

## 2013-04-20 ENCOUNTER — Ambulatory Visit (HOSPITAL_COMMUNITY): Payer: BC Managed Care – PPO

## 2013-04-21 ENCOUNTER — Ambulatory Visit (HOSPITAL_COMMUNITY)
Admission: RE | Admit: 2013-04-21 | Discharge: 2013-04-21 | Disposition: A | Discharge status: Home / Self Care | Payer: PRIVATE HEALTH INSURANCE | Source: Ambulatory Visit | Attending: Obstetrics | Admitting: Obstetrics

## 2013-04-21 DIAGNOSIS — Z780 Asymptomatic menopausal state: Secondary | ICD-10-CM | POA: Insufficient documentation

## 2013-04-21 DIAGNOSIS — Z13820 Encounter for screening for osteoporosis: Secondary | ICD-10-CM | POA: Insufficient documentation

## 2013-04-21 DIAGNOSIS — N951 Menopausal and female climacteric states: Secondary | ICD-10-CM

## 2013-04-24 ENCOUNTER — Encounter: Payer: Self-pay | Admitting: Obstetrics

## 2013-04-29 ENCOUNTER — Other Ambulatory Visit: Payer: Self-pay | Admitting: Obstetrics

## 2013-04-29 DIAGNOSIS — Z1231 Encounter for screening mammogram for malignant neoplasm of breast: Secondary | ICD-10-CM

## 2013-05-01 ENCOUNTER — Ambulatory Visit (HOSPITAL_COMMUNITY): Payer: BC Managed Care – PPO

## 2013-05-08 ENCOUNTER — Ambulatory Visit (HOSPITAL_COMMUNITY)
Admission: RE | Admit: 2013-05-08 | Discharge: 2013-05-08 | Disposition: A | Discharge status: Home / Self Care | Payer: PRIVATE HEALTH INSURANCE | Source: Ambulatory Visit | Attending: Obstetrics | Admitting: Obstetrics

## 2013-05-08 DIAGNOSIS — Z1231 Encounter for screening mammogram for malignant neoplasm of breast: Secondary | ICD-10-CM | POA: Insufficient documentation

## 2013-06-09 ENCOUNTER — Other Ambulatory Visit: Payer: Self-pay | Admitting: Obstetrics

## 2013-06-09 DIAGNOSIS — M25569 Pain in unspecified knee: Secondary | ICD-10-CM

## 2013-06-09 DIAGNOSIS — M25469 Effusion, unspecified knee: Secondary | ICD-10-CM

## 2014-08-17 ENCOUNTER — Ambulatory Visit (INDEPENDENT_AMBULATORY_CARE_PROVIDER_SITE_OTHER): Payer: No Typology Code available for payment source | Admitting: Obstetrics

## 2014-08-17 ENCOUNTER — Encounter: Payer: Self-pay | Admitting: Obstetrics

## 2014-08-17 VITALS — Wt 205.0 lb

## 2014-08-17 DIAGNOSIS — Z124 Encounter for screening for malignant neoplasm of cervix: Secondary | ICD-10-CM

## 2014-08-17 DIAGNOSIS — Z01419 Encounter for gynecological examination (general) (routine) without abnormal findings: Secondary | ICD-10-CM | POA: Diagnosis not present

## 2014-08-17 DIAGNOSIS — R102 Pelvic and perineal pain: Secondary | ICD-10-CM

## 2014-08-17 NOTE — Progress Notes (Signed)
Subjective:        Monique Reynolds is a 64 y.o. female here for a routine exam.  Current complaints: RLQ pain.  Denies N/V, diarrhea / constipation or dysuria.  Pain is worse after being on feet for prolonged periods of time, and improves with rest.  Personal health questionnaire:  Is patient Ashkenazi Jewish, have a family history of breast and/or ovarian cancer: no Is there a family history of uterine cancer diagnosed at age < 76, gastrointestinal cancer, urinary tract cancer, family member who is a Field seismologist syndrome-associated carrier: no Is the patient overweight and hypertensive, family history of diabetes, personal history of gestational diabetes, preeclampsia or PCOS: no Is patient over 44, have PCOS,  family history of premature CHD under age 75, diabetes, smoke, have hypertension or peripheral artery disease:  no At any time, has a partner hit, kicked or otherwise hurt or frightened you?: no Over the past 2 weeks, have you felt down, depressed or hopeless?: no Over the past 2 weeks, have you felt little interest or pleasure in doing things?:no   Gynecologic History No LMP recorded. Patient is postmenopausal. Contraception: post menopausal status Last Pap: 2015. Results were: normal Last mammogram: 2015. Results were: micro calcifications   Obstetric History OB History  No data available    Past Medical History  Diagnosis Date  . Diabetes mellitus   . Atypical ductal hyperplasia of breast - right  10/04/2011  . Sleep apnea     mild-no cpap recomended  . Hypertension   . Family history of anesthesia complication     mother and daughter take along time to wake up  . Depression   . Torn meniscus     Past Surgical History  Procedure Laterality Date  . Tonsillectomy  1956  . Finger surgery  2004  . Wisdom tooth extraction    . Nevus excision      vulvar rt   . Breast surgery  10/29/11    Rt breast lumpectomy     Current outpatient prescriptions:  .  aspirin 81  MG tablet, Take 81 mg by mouth daily., Disp: , Rfl:  .  co-enzyme Q-10 30 MG capsule, Take 30 mg by mouth 3 (three) times daily., Disp: , Rfl:  .  Exenatide ER (BYDUREON) 2 MG PEN, Inject into the skin., Disp: , Rfl:  .  fish oil-omega-3 fatty acids 1000 MG capsule, Take 2 g by mouth daily., Disp: , Rfl:  .  glimepiride (AMARYL) 2 MG tablet, Take 2 mg by mouth daily before breakfast., Disp: , Rfl:  .  Levothyroxine Sodium (SYNTHROID PO), Take 0.25 mg by mouth., Disp: , Rfl:  .  lisinopril (PRINIVIL,ZESTRIL) 5 MG tablet, Take 5 mg by mouth every evening., Disp: , Rfl:  .  metFORMIN (GLUCOPHAGE) 500 MG tablet, Take 500 mg by mouth every evening. To equal 1500 mg in evening, Disp: , Rfl:  .  Multiple Vitamin (MULTIVITAMIN) tablet, Take 1 tablet by mouth daily., Disp: , Rfl:  .  simvastatin (ZOCOR) 40 MG tablet, Take 40 mg by mouth every evening., Disp: , Rfl:  .  citalopram (CELEXA) 20 MG tablet, Take 20 mg by mouth at bedtime. , Disp: , Rfl:  .  estradiol-levonorgestrel (CLIMARAPRO) 0.045-0.015 MG/DAY, Place 1 patch onto the skin once a week. (Patient not taking: Reported on 08/17/2014), Disp: 4 patch, Rfl: prn Allergies  Allergen Reactions  . Compazine [Prochlorperazine Edisylate]     Pt does not remember reaction    History  Substance Use Topics  . Smoking status: Former Smoker    Quit date: 02/12/2006  . Smokeless tobacco: Never Used  . Alcohol Use: 0.0 oz/week    0 Standard drinks or equivalent per week     Comment: socially    Family History  Problem Relation Age of Onset  . Cancer Mother     breast & Ovarian & melanoma  . Heart disease Father   . Hypertension Father   . Cancer Paternal Aunt     breast  . Stroke Maternal Grandmother   . Stroke Paternal Grandmother   . Cancer Cousin     colon      Review of Systems  Constitutional: negative for fatigue and weight loss Respiratory: negative for cough and wheezing Cardiovascular: negative for chest pain, fatigue and  palpitations Gastrointestinal: negative for abdominal pain and change in bowel habits Musculoskeletal:negative for myalgias Neurological: negative for gait problems and tremors Behavioral/Psych: negative for abusive relationship, depression Endocrine: negative for temperature intolerance   Genitourinary:negative for abnormal menstrual periods, genital lesions, hot flashes, sexual problems and vaginal discharge.  Positive for RLQ pain. Integument/breast: negative for breast lump, breast tenderness, nipple discharge and skin lesion(s)    Objective:       Wt 205 lb (92.987 kg) General:   alert  Skin:   no rash or abnormalities  Lungs:   clear to auscultation bilaterally  Heart:   regular rate and rhythm, S1, S2 normal, no murmur, click, rub or gallop  Breasts:   normal without suspicious masses, skin or nipple changes or axillary nodes  Abdomen:  normal findings: no organomegaly, soft, non-tender and no hernia  Pelvis:  External genitalia: normal general appearance Urinary system: urethral meatus normal and bladder without fullness, nontender Vaginal: normal without tenderness, induration or masses Cervix: normal appearance Adnexa: tenderness to deep palpation RLQ.  No masses. Uterus: anteverted and non-tender, normal size   Lab Review Urine pregnancy test Labs reviewed yes Radiologic studies reviewed yes    Assessment:    Healthy female exam.    Pelvic pain, RLQ.   Plan:   Ultrasound ordered  Education reviewed: calcium supplements, low fat, low cholesterol diet, self breast exams and weight bearing exercise. Follow up in: 1 year.   Meds ordered this encounter  Medications  . Exenatide ER (BYDUREON) 2 MG PEN    Sig: Inject into the skin.  . Levothyroxine Sodium (SYNTHROID PO)    Sig: Take 0.25 mg by mouth.   Orders Placed This Encounter  Procedures  . SureSwab, Vaginosis/Vaginitis Plus  . US Pelvis Complete    Standing Status: Future     Number of Occurrences:       Standing Expiration Date: 10/18/2015    Order Specific Question:  Reason for Exam (SYMPTOM  OR DIAGNOSIS REQUIRED)    Answer:  pelvic pain    Order Specific Question:  Preferred imaging location?    Answer:  [facility]  . US Transvaginal Non-OB    Standing Status: Future     Number of Occurrences:      Standing Expiration Date: 10/18/2015    Order Specific Question:  Reason for Exam (SYMPTOM  OR DIAGNOSIS REQUIRED)    Answer:  pelvic pain    Order Specific Question:  Preferred imaging location?    Answer:  [facility]

## 2014-08-19 LAB — PAP IG AND HPV HIGH-RISK: HPV DNA High Risk: NOT DETECTED

## 2014-08-20 LAB — SURESWAB, VAGINOSIS/VAGINITIS PLUS
ATOPOBIUM VAGINAE: NOT DETECTED Log (cells/mL)
C. ALBICANS, DNA: NOT DETECTED
C. glabrata, DNA: NOT DETECTED
C. parapsilosis, DNA: NOT DETECTED
C. trachomatis RNA, TMA: NOT DETECTED
C. tropicalis, DNA: NOT DETECTED
GARDNERELLA VAGINALIS: NOT DETECTED Log (cells/mL)
LACTOBACILLUS SPECIES: 6.7 Log (cells/mL)
MEGASPHAERA SPECIES: NOT DETECTED Log (cells/mL)
N. gonorrhoeae RNA, TMA: NOT DETECTED
T. VAGINALIS RNA, QL TMA: NOT DETECTED

## 2014-08-25 ENCOUNTER — Ambulatory Visit (HOSPITAL_COMMUNITY)
Admission: RE | Admit: 2014-08-25 | Discharge: 2014-08-25 | Disposition: A | Discharge status: Home / Self Care | Payer: No Typology Code available for payment source | Source: Ambulatory Visit | Attending: Obstetrics | Admitting: Obstetrics

## 2014-08-25 DIAGNOSIS — R102 Pelvic and perineal pain: Secondary | ICD-10-CM | POA: Diagnosis not present

## 2015-06-07 DIAGNOSIS — H11151 Pinguecula, right eye: Secondary | ICD-10-CM | POA: Diagnosis not present

## 2015-06-07 DIAGNOSIS — H25012 Cortical age-related cataract, left eye: Secondary | ICD-10-CM | POA: Diagnosis not present

## 2015-06-07 DIAGNOSIS — H2513 Age-related nuclear cataract, bilateral: Secondary | ICD-10-CM | POA: Diagnosis not present

## 2015-06-07 DIAGNOSIS — H5203 Hypermetropia, bilateral: Secondary | ICD-10-CM | POA: Diagnosis not present

## 2015-06-07 DIAGNOSIS — E119 Type 2 diabetes mellitus without complications: Secondary | ICD-10-CM | POA: Diagnosis not present

## 2015-08-18 ENCOUNTER — Ambulatory Visit: Payer: No Typology Code available for payment source | Admitting: Obstetrics

## 2015-09-23 DIAGNOSIS — E119 Type 2 diabetes mellitus without complications: Secondary | ICD-10-CM | POA: Diagnosis not present

## 2015-09-23 DIAGNOSIS — J069 Acute upper respiratory infection, unspecified: Secondary | ICD-10-CM | POA: Diagnosis not present

## 2015-09-23 DIAGNOSIS — B9789 Other viral agents as the cause of diseases classified elsewhere: Secondary | ICD-10-CM | POA: Diagnosis not present

## 2015-09-23 DIAGNOSIS — J029 Acute pharyngitis, unspecified: Secondary | ICD-10-CM | POA: Diagnosis not present

## 2015-10-03 IMAGING — US US PELVIS COMPLETE
1 series · 14 of 25 positions shown · non-contrast
Comparison: None

CLINICAL DATA: Pelvic pain for 3 months.

EXAM:
TRANSABDOMINAL AND TRANSVAGINAL ULTRASOUND OF PELVIS
TECHNIQUE: Both transabdominal and transvaginal ultrasound examinations of the
pelvis were performed. Transabdominal technique was performed for
global imaging of the pelvis including uterus, ovaries, adnexal
regions, and pelvic cul-de-sac. It was necessary to proceed with
endovaginal exam following the transabdominal exam to visualize the
uterus, endometrium and ovaries.

[Series 1: us non-ob tv/pel · 14 of 47 slices shown]
[im 1/47]
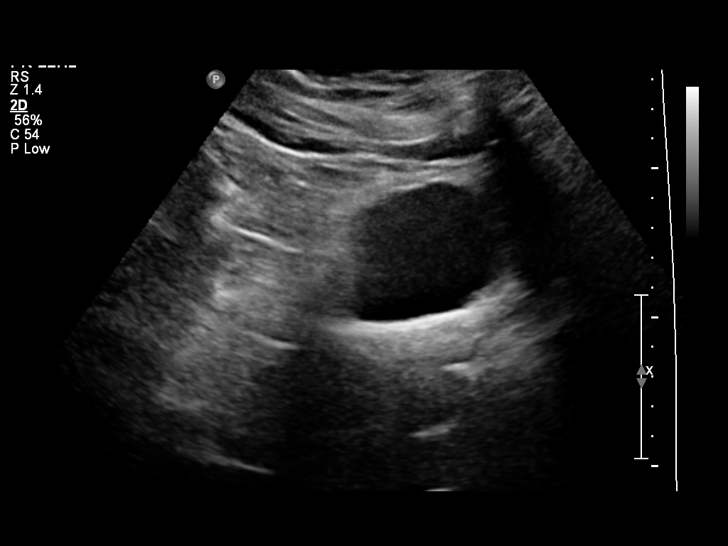
[im 4/47]
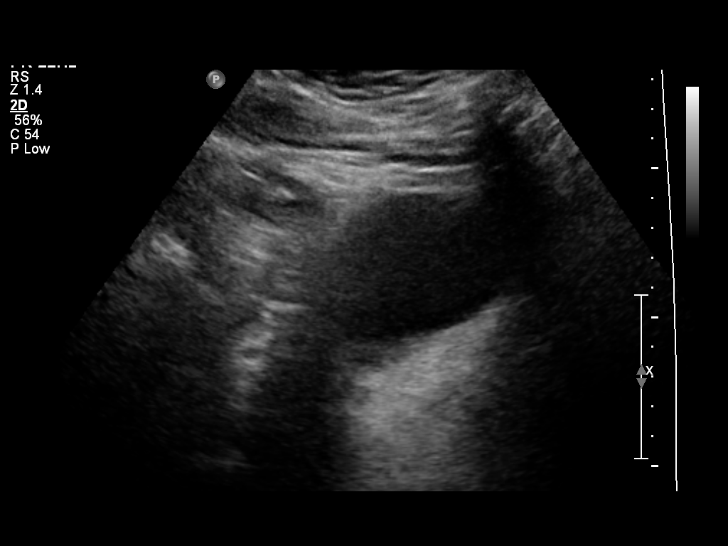
[im 8/47]
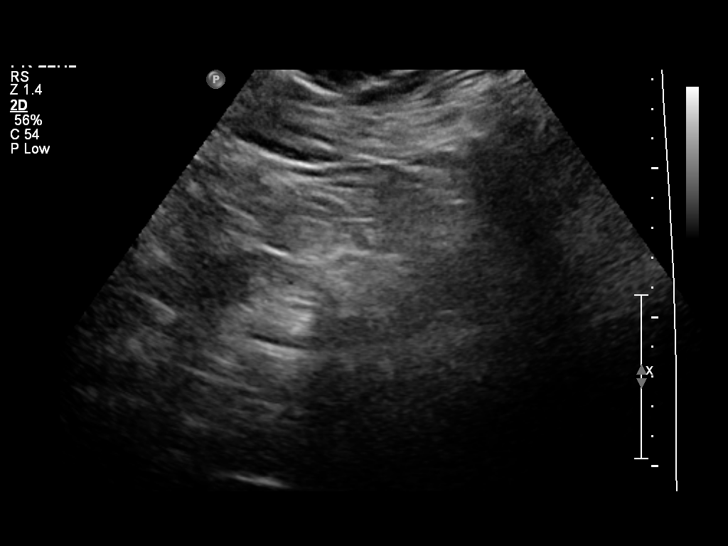
[im 12/47]
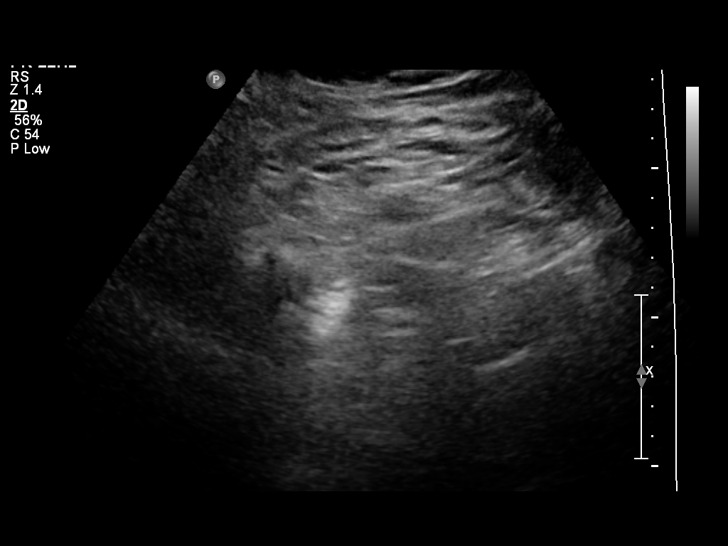
[im 16/47]
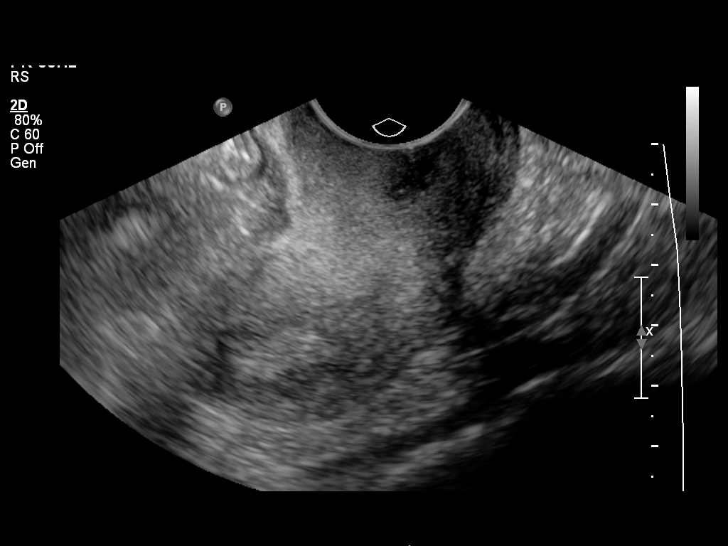
[im 18/47]
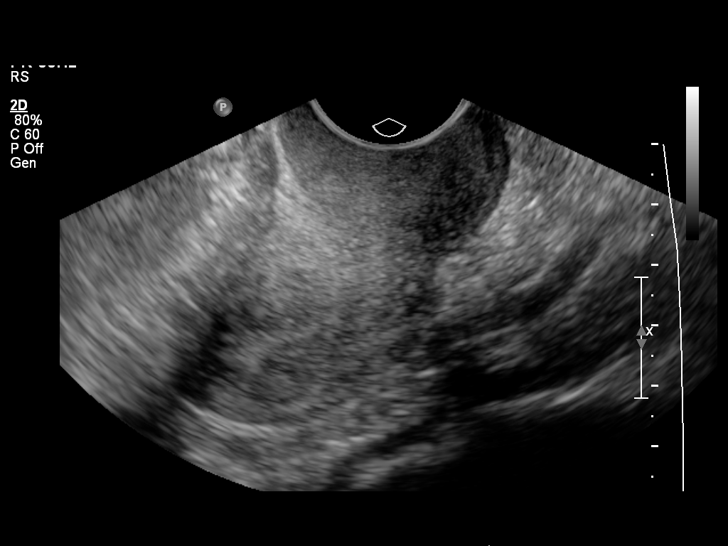
[im 22/47]
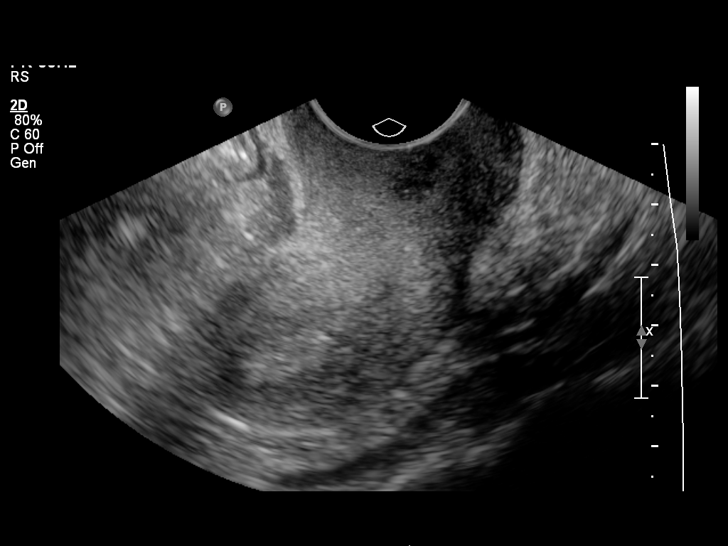
[im 25/47]
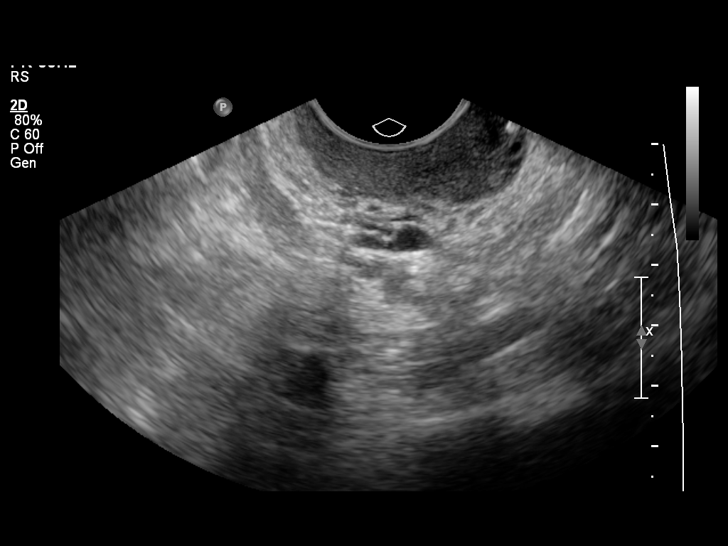
[im 29/47]
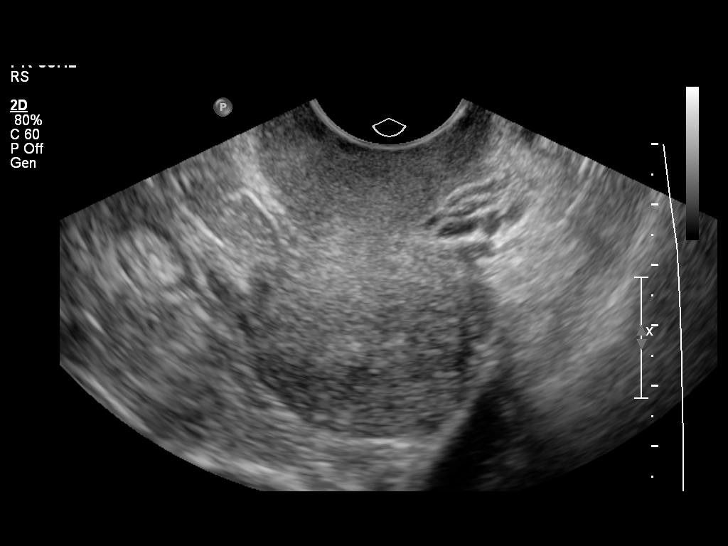
[im 31/47]
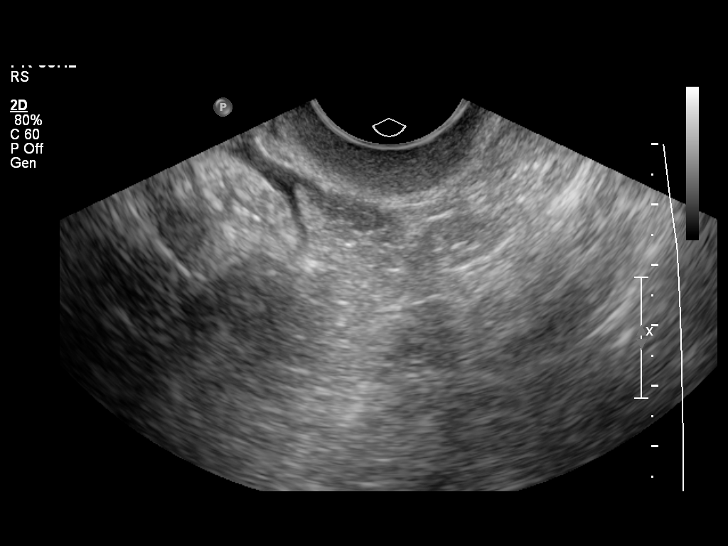
[im 35/47]
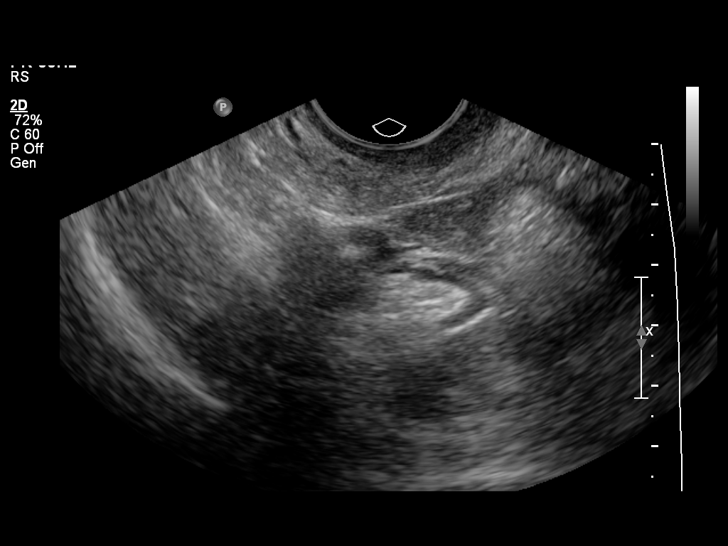
[im 39/47]
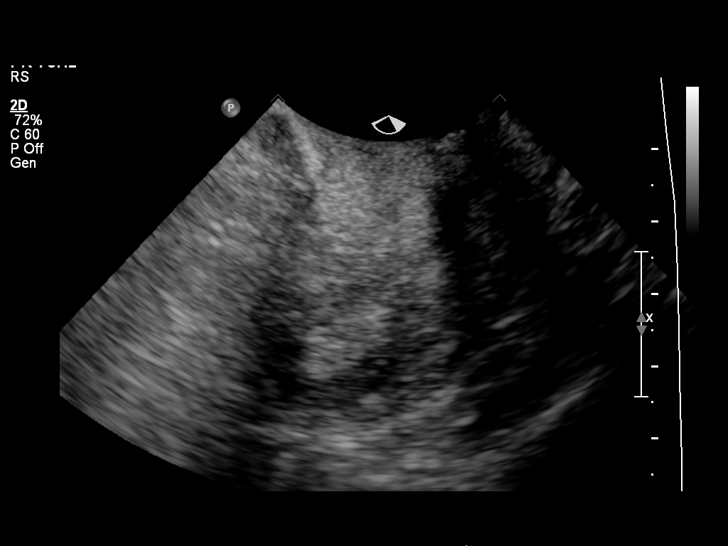
[im 43/47]
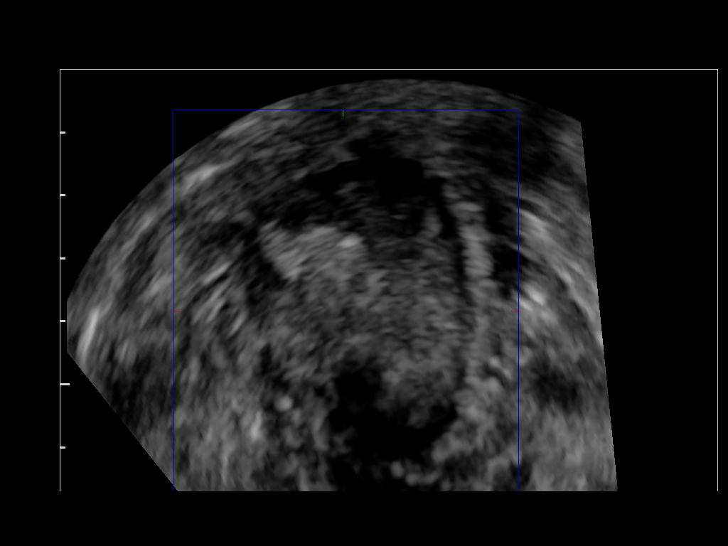
[im 47/47]
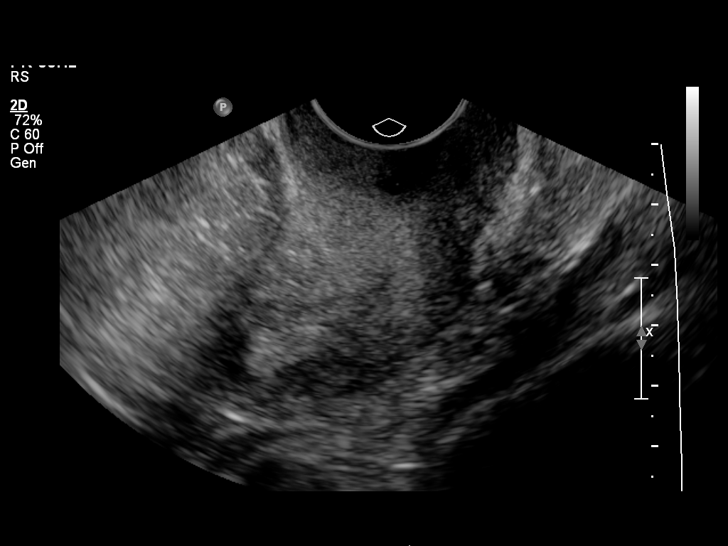

[14 of 25 positions shown; findings below may reference images not displayed]

FINDINGS: Uterus

Measurements: 6.8 x 3.1 x 4.0 cm. No fibroids or other mass
visualized.

Endometrium

Thickness: 7 mm..  No focal abnormality visualized.

Right ovary

Measurements: Not visualize.. No adnexal mass.

Left ovary

Measurements: Not visualize.. No adnexal mass.

Other findings

No free fluid.
IMPRESSION: 1. No findings to explain patient's pelvic pain.

## 2016-02-20 DIAGNOSIS — Z803 Family history of malignant neoplasm of breast: Secondary | ICD-10-CM | POA: Diagnosis not present

## 2016-02-20 DIAGNOSIS — R921 Mammographic calcification found on diagnostic imaging of breast: Secondary | ICD-10-CM | POA: Diagnosis not present

## 2016-02-21 ENCOUNTER — Encounter: Payer: Self-pay | Admitting: *Deleted

## 2016-03-28 DIAGNOSIS — E119 Type 2 diabetes mellitus without complications: Secondary | ICD-10-CM | POA: Diagnosis not present

## 2016-03-28 DIAGNOSIS — Z1159 Encounter for screening for other viral diseases: Secondary | ICD-10-CM | POA: Diagnosis not present

## 2016-03-28 DIAGNOSIS — Z1211 Encounter for screening for malignant neoplasm of colon: Secondary | ICD-10-CM | POA: Diagnosis not present

## 2016-03-28 DIAGNOSIS — J22 Unspecified acute lower respiratory infection: Secondary | ICD-10-CM | POA: Diagnosis not present

## 2016-07-10 DIAGNOSIS — H25013 Cortical age-related cataract, bilateral: Secondary | ICD-10-CM | POA: Diagnosis not present

## 2016-07-10 DIAGNOSIS — H2513 Age-related nuclear cataract, bilateral: Secondary | ICD-10-CM | POA: Diagnosis not present

## 2016-07-10 DIAGNOSIS — H11151 Pinguecula, right eye: Secondary | ICD-10-CM | POA: Diagnosis not present

## 2016-07-10 DIAGNOSIS — E119 Type 2 diabetes mellitus without complications: Secondary | ICD-10-CM | POA: Diagnosis not present

## 2016-07-25 DIAGNOSIS — L821 Other seborrheic keratosis: Secondary | ICD-10-CM | POA: Diagnosis not present

## 2016-07-31 DIAGNOSIS — Z780 Asymptomatic menopausal state: Secondary | ICD-10-CM | POA: Diagnosis not present

## 2016-07-31 DIAGNOSIS — B9789 Other viral agents as the cause of diseases classified elsewhere: Secondary | ICD-10-CM | POA: Diagnosis not present

## 2016-07-31 DIAGNOSIS — R109 Unspecified abdominal pain: Secondary | ICD-10-CM | POA: Diagnosis not present

## 2016-07-31 DIAGNOSIS — R197 Diarrhea, unspecified: Secondary | ICD-10-CM | POA: Diagnosis not present

## 2016-07-31 DIAGNOSIS — R1033 Periumbilical pain: Secondary | ICD-10-CM | POA: Diagnosis not present

## 2016-07-31 DIAGNOSIS — N3000 Acute cystitis without hematuria: Secondary | ICD-10-CM | POA: Diagnosis not present

## 2016-07-31 DIAGNOSIS — N3001 Acute cystitis with hematuria: Secondary | ICD-10-CM | POA: Diagnosis not present

## 2016-07-31 DIAGNOSIS — R1084 Generalized abdominal pain: Secondary | ICD-10-CM | POA: Diagnosis not present

## 2016-07-31 DIAGNOSIS — N3091 Cystitis, unspecified with hematuria: Secondary | ICD-10-CM | POA: Diagnosis not present

## 2016-08-03 DIAGNOSIS — L570 Actinic keratosis: Secondary | ICD-10-CM | POA: Diagnosis not present

## 2016-08-06 DIAGNOSIS — F411 Generalized anxiety disorder: Secondary | ICD-10-CM | POA: Diagnosis not present

## 2016-08-06 DIAGNOSIS — I10 Essential (primary) hypertension: Secondary | ICD-10-CM | POA: Diagnosis not present

## 2016-08-06 DIAGNOSIS — E039 Hypothyroidism, unspecified: Secondary | ICD-10-CM | POA: Diagnosis not present

## 2016-08-06 DIAGNOSIS — E119 Type 2 diabetes mellitus without complications: Secondary | ICD-10-CM | POA: Diagnosis not present

## 2016-08-07 DIAGNOSIS — E119 Type 2 diabetes mellitus without complications: Secondary | ICD-10-CM | POA: Diagnosis not present

## 2016-09-15 DIAGNOSIS — E7800 Pure hypercholesterolemia, unspecified: Secondary | ICD-10-CM | POA: Diagnosis not present

## 2016-09-15 DIAGNOSIS — R0600 Dyspnea, unspecified: Secondary | ICD-10-CM | POA: Diagnosis not present

## 2016-09-15 DIAGNOSIS — R0602 Shortness of breath: Secondary | ICD-10-CM | POA: Diagnosis not present

## 2016-09-15 DIAGNOSIS — M79604 Pain in right leg: Secondary | ICD-10-CM | POA: Diagnosis not present

## 2016-09-15 DIAGNOSIS — I2510 Atherosclerotic heart disease of native coronary artery without angina pectoris: Secondary | ICD-10-CM | POA: Diagnosis not present

## 2016-09-15 DIAGNOSIS — M791 Myalgia: Secondary | ICD-10-CM | POA: Diagnosis not present

## 2016-09-15 DIAGNOSIS — M25461 Effusion, right knee: Secondary | ICD-10-CM | POA: Diagnosis not present

## 2016-09-15 DIAGNOSIS — E119 Type 2 diabetes mellitus without complications: Secondary | ICD-10-CM | POA: Diagnosis not present

## 2016-09-15 DIAGNOSIS — M7989 Other specified soft tissue disorders: Secondary | ICD-10-CM | POA: Diagnosis not present

## 2016-09-15 DIAGNOSIS — J9811 Atelectasis: Secondary | ICD-10-CM | POA: Diagnosis not present

## 2016-09-15 DIAGNOSIS — M1990 Unspecified osteoarthritis, unspecified site: Secondary | ICD-10-CM | POA: Diagnosis not present

## 2016-09-16 DIAGNOSIS — Z79899 Other long term (current) drug therapy: Secondary | ICD-10-CM | POA: Diagnosis not present

## 2016-09-16 DIAGNOSIS — M7989 Other specified soft tissue disorders: Secondary | ICD-10-CM | POA: Diagnosis not present

## 2016-09-16 DIAGNOSIS — M7121 Synovial cyst of popliteal space [Baker], right knee: Secondary | ICD-10-CM | POA: Diagnosis not present

## 2016-09-16 DIAGNOSIS — E119 Type 2 diabetes mellitus without complications: Secondary | ICD-10-CM | POA: Diagnosis not present

## 2016-09-16 DIAGNOSIS — M79604 Pain in right leg: Secondary | ICD-10-CM | POA: Diagnosis not present

## 2016-09-16 DIAGNOSIS — E7800 Pure hypercholesterolemia, unspecified: Secondary | ICD-10-CM | POA: Diagnosis not present

## 2016-09-17 DIAGNOSIS — M2391 Unspecified internal derangement of right knee: Secondary | ICD-10-CM | POA: Diagnosis not present

## 2016-09-17 DIAGNOSIS — Z6836 Body mass index (BMI) 36.0-36.9, adult: Secondary | ICD-10-CM | POA: Diagnosis not present

## 2016-09-17 DIAGNOSIS — M25561 Pain in right knee: Secondary | ICD-10-CM | POA: Diagnosis not present

## 2016-09-23 DIAGNOSIS — M25561 Pain in right knee: Secondary | ICD-10-CM | POA: Diagnosis not present

## 2016-09-28 ENCOUNTER — Encounter: Payer: Self-pay | Admitting: Sports Medicine

## 2016-09-28 ENCOUNTER — Ambulatory Visit (INDEPENDENT_AMBULATORY_CARE_PROVIDER_SITE_OTHER): Payer: BLUE CROSS/BLUE SHIELD | Admitting: Sports Medicine

## 2016-09-28 DIAGNOSIS — M170 Bilateral primary osteoarthritis of knee: Secondary | ICD-10-CM | POA: Insufficient documentation

## 2016-09-28 DIAGNOSIS — M1711 Unilateral primary osteoarthritis, right knee: Secondary | ICD-10-CM | POA: Diagnosis not present

## 2016-09-28 MED ORDER — MELOXICAM 15 MG PO TABS: ORAL_TABLET | ORAL | 3 refills | Status: DC

## 2016-09-28 NOTE — Progress Notes (Signed)
   Subjective:    I'm seeing this patient as a consultation for:  Dr. Baltazar Najjar  CC: Right knee pain  HPI: This is a pleasant 66 year old female, she has had medial knee pain for 3 weeks now.  She has seen an orthopaedist who ordered an MRI, OTC NSAIDS and analgesics have been ineffective.  MRI results will be dictated below.  She was unable to get in to her orthopedist and presents here for increasing pain. Pain is moderate, persistent, localized without radiation, no mechanical symptoms.   Past medical history, Surgical history, Family history not pertinant except as noted below, Social history, Allergies, and medications have been entered into the medical record, reviewed, and no changes needed.   Review of Systems: No headache, visual changes, nausea, vomiting, diarrhea, constipation, dizziness, abdominal pain, skin rash, fevers, chills, night sweats, weight loss, swollen lymph nodes, body aches, joint swelling, muscle aches, chest pain, shortness of breath, mood changes, visual or auditory hallucinations.   Objective:   General: Well Developed, well nourished, and in no acute distress.  Neuro:  Extra-ocular muscles intact, able to move all 4 extremities, sensation grossly intact.  Deep tendon reflexes tested were normal. Psych: Alert and oriented, mood congruent with affect. ENT:  Ears and nose appear unremarkable.  Hearing grossly normal. Neck: Unremarkable overall appearance, trachea midline.  No visible thyroid enlargement. Eyes: Conjunctivae and lids appear unremarkable.  Pupils equal and round. Skin: Warm and dry, no rashes noted.  Cardiovascular: Pulses palpable, no extremity edema. Right Knee: Normal to inspection with no erythema or effusion or obvious bony abnormalities. Tender to palpation at the medial joint line ROM normal in flexion and extension and lower leg rotation. Ligaments with solid consistent endpoints including ACL, PCL, LCL, MCL. Positive McMurray sign  with pain but no palpable mechanical symptoms Non painful patellar compression. Patellar and quadriceps tendons unremarkable. Hamstring and quadriceps strength is normal.  Procedure: Real-time Ultrasound Guided Injection of right knee Device: GE Logiq E  Verbal informed consent obtained.  Time-out conducted.  Noted no overlying erythema, induration, or other signs of local infection.  Skin prepped in a sterile fashion.  Local anesthesia: Topical Ethyl chloride.  With sterile technique and under real time ultrasound guidance: 1 mL Kenalog 40, 2 mL lidocaine, 2 mL bupivacaine injected easily  Completed without difficulty  Pain immediately resolved suggesting accurate placement of the medication.  Advised to call if fevers/chills, erythema, induration, drainage, or persistent bleeding.  Images permanently stored and available for review in the ultrasound unit.  Impression: Technically successful ultrasound guided injection.  Impression and Recommendations:   This case required medical decision making of moderate complexity.  Primary osteoarthritis of right knee MRI done at an outside facility shows osteoarthritis with degenerative meniscal tearing. No mechanical symptoms, ibuprofen has not been effective so we are proceeding to injection. Adding meloxicam, rehabilitation exercises.  Return in one month.

## 2016-09-28 NOTE — Assessment & Plan Note (Signed)
MRI done at an outside facility shows osteoarthritis with degenerative meniscal tearing. No mechanical symptoms, ibuprofen has not been effective so we are proceeding to injection. Adding meloxicam, rehabilitation exercises.  Return in one month.

## 2016-10-26 ENCOUNTER — Ambulatory Visit: Payer: BLUE CROSS/BLUE SHIELD | Admitting: Sports Medicine

## 2016-11-01 ENCOUNTER — Ambulatory Visit (INDEPENDENT_AMBULATORY_CARE_PROVIDER_SITE_OTHER): Payer: BLUE CROSS/BLUE SHIELD | Admitting: Sports Medicine

## 2016-11-01 DIAGNOSIS — M170 Bilateral primary osteoarthritis of knee: Secondary | ICD-10-CM

## 2016-11-01 NOTE — Assessment & Plan Note (Signed)
Right knee is doing fantastic after injection at the last visit. Twisting injury of the left knee, similar symptoms, injection as above, formal physical therapy.  Return in one month.

## 2016-11-01 NOTE — Progress Notes (Signed)
  Subjective:    CC: Left knee pain  HPI: This is a pleasant 66 year old female, I injected her right knee at the last visit, she has complete resolution, more recently she was helping her son move into college, and twisted her knee. She had immediate pain at the medial joint line and posteriorly, no overt locking or catching. Moderate, persistent without radiation.  Past medical history:  Negative.  See flowsheet/record as well for more information.  Surgical history: Negative.  See flowsheet/record as well for more information.  Family history: Negative.  See flowsheet/record as well for more information.  Social history: Negative.  See flowsheet/record as well for more information.  Allergies, and medications have been entered into the medical record, reviewed, and no changes needed.   Review of Systems: No fevers, chills, night sweats, weight loss, chest pain, or shortness of breath.   Objective:    General: Well Developed, well nourished, and in no acute distress.  Neuro: Alert and oriented x3, extra-ocular muscles intact, sensation grossly intact.  HEENT: Normocephalic, atraumatic, pupils equal round reactive to light, neck supple, no masses, no lymphadenopathy, thyroid nonpalpable.  Skin: Warm and dry, no rashes. Cardiac: Regular rate and rhythm, no murmurs rubs or gallops, no lower extremity edema.  Respiratory: Clear to auscultation bilaterally. Not using accessory muscles, speaking in full sentences. Left Knee: Normal to inspection with no erythema or effusion or obvious bony abnormalities. Tender to palpation at the medial joint line ROM normal in flexion and extension and lower leg rotation. Ligaments with solid consistent endpoints including ACL, PCL, LCL, MCL. Positive McMurray sign with pain but no palpable click Pain with terminal flexion. Non painful patellar compression. Patellar and quadriceps tendons unremarkable. Hamstring and quadriceps strength is  normal.  Procedure: Real-time Ultrasound Guided Injection of left knee Device: GE Logiq E  Verbal informed consent obtained.  Time-out conducted.  Noted no overlying erythema, induration, or other signs of local infection.  Skin prepped in a sterile fashion.  Local anesthesia: Topical Ethyl chloride.  With sterile technique and under real time ultrasound guidance:  1 mL Kenalog 40, 2 mL lidocaine, 2 mL the pubic and injected easily into the suprapatellar recess. Completed without difficulty  Pain immediately resolved suggesting accurate placement of the medication.  Advised to call if fevers/chills, erythema, induration, drainage, or persistent bleeding.  Images permanently stored and available for review in the ultrasound unit.  Impression: Technically successful ultrasound guided injection.  Impression and Recommendations:    Primary osteoarthritis of both knees Right knee is doing fantastic after injection at the last visit. Twisting injury of the left knee, similar symptoms, injection as above, formal physical therapy.  Return in one month.  ___________________________________________ Gwen Her. Dianah Field, M.D., ABFM., CAQSM. Primary Care and [place] Instructor of [employer] [facility] of Medicine

## 2016-11-05 DIAGNOSIS — R262 Difficulty in walking, not elsewhere classified: Secondary | ICD-10-CM | POA: Diagnosis not present

## 2016-11-05 DIAGNOSIS — M25561 Pain in right knee: Secondary | ICD-10-CM | POA: Diagnosis not present

## 2016-11-05 DIAGNOSIS — M25562 Pain in left knee: Secondary | ICD-10-CM | POA: Diagnosis not present

## 2016-11-05 DIAGNOSIS — M6281 Muscle weakness (generalized): Secondary | ICD-10-CM | POA: Diagnosis not present

## 2016-11-07 DIAGNOSIS — R262 Difficulty in walking, not elsewhere classified: Secondary | ICD-10-CM | POA: Diagnosis not present

## 2016-11-07 DIAGNOSIS — M25561 Pain in right knee: Secondary | ICD-10-CM | POA: Diagnosis not present

## 2016-11-07 DIAGNOSIS — M6281 Muscle weakness (generalized): Secondary | ICD-10-CM | POA: Diagnosis not present

## 2016-11-07 DIAGNOSIS — M25562 Pain in left knee: Secondary | ICD-10-CM | POA: Diagnosis not present

## 2016-11-13 DIAGNOSIS — M6281 Muscle weakness (generalized): Secondary | ICD-10-CM | POA: Diagnosis not present

## 2016-11-13 DIAGNOSIS — M25562 Pain in left knee: Secondary | ICD-10-CM | POA: Diagnosis not present

## 2016-11-13 DIAGNOSIS — M25561 Pain in right knee: Secondary | ICD-10-CM | POA: Diagnosis not present

## 2016-11-13 DIAGNOSIS — R262 Difficulty in walking, not elsewhere classified: Secondary | ICD-10-CM | POA: Diagnosis not present

## 2016-11-15 DIAGNOSIS — M25561 Pain in right knee: Secondary | ICD-10-CM | POA: Diagnosis not present

## 2016-11-15 DIAGNOSIS — R262 Difficulty in walking, not elsewhere classified: Secondary | ICD-10-CM | POA: Diagnosis not present

## 2016-11-15 DIAGNOSIS — M6281 Muscle weakness (generalized): Secondary | ICD-10-CM | POA: Diagnosis not present

## 2016-11-15 DIAGNOSIS — M25562 Pain in left knee: Secondary | ICD-10-CM | POA: Diagnosis not present

## 2016-11-19 DIAGNOSIS — M25562 Pain in left knee: Secondary | ICD-10-CM | POA: Diagnosis not present

## 2016-11-19 DIAGNOSIS — M6281 Muscle weakness (generalized): Secondary | ICD-10-CM | POA: Diagnosis not present

## 2016-11-19 DIAGNOSIS — M25561 Pain in right knee: Secondary | ICD-10-CM | POA: Diagnosis not present

## 2016-11-19 DIAGNOSIS — R262 Difficulty in walking, not elsewhere classified: Secondary | ICD-10-CM | POA: Diagnosis not present

## 2016-11-21 DIAGNOSIS — M25562 Pain in left knee: Secondary | ICD-10-CM | POA: Diagnosis not present

## 2016-11-21 DIAGNOSIS — M25561 Pain in right knee: Secondary | ICD-10-CM | POA: Diagnosis not present

## 2016-11-21 DIAGNOSIS — R262 Difficulty in walking, not elsewhere classified: Secondary | ICD-10-CM | POA: Diagnosis not present

## 2016-11-21 DIAGNOSIS — M6281 Muscle weakness (generalized): Secondary | ICD-10-CM | POA: Diagnosis not present

## 2016-11-26 DIAGNOSIS — M25562 Pain in left knee: Secondary | ICD-10-CM | POA: Diagnosis not present

## 2016-11-26 DIAGNOSIS — M25561 Pain in right knee: Secondary | ICD-10-CM | POA: Diagnosis not present

## 2016-11-26 DIAGNOSIS — R262 Difficulty in walking, not elsewhere classified: Secondary | ICD-10-CM | POA: Diagnosis not present

## 2016-11-26 DIAGNOSIS — M6281 Muscle weakness (generalized): Secondary | ICD-10-CM | POA: Diagnosis not present

## 2016-11-28 DIAGNOSIS — R262 Difficulty in walking, not elsewhere classified: Secondary | ICD-10-CM | POA: Diagnosis not present

## 2016-11-28 DIAGNOSIS — M25561 Pain in right knee: Secondary | ICD-10-CM | POA: Diagnosis not present

## 2016-11-28 DIAGNOSIS — M25562 Pain in left knee: Secondary | ICD-10-CM | POA: Diagnosis not present

## 2016-11-28 DIAGNOSIS — M6281 Muscle weakness (generalized): Secondary | ICD-10-CM | POA: Diagnosis not present

## 2016-11-29 ENCOUNTER — Ambulatory Visit (INDEPENDENT_AMBULATORY_CARE_PROVIDER_SITE_OTHER): Payer: BLUE CROSS/BLUE SHIELD | Admitting: Sports Medicine

## 2016-11-29 ENCOUNTER — Encounter: Payer: Self-pay | Admitting: Sports Medicine

## 2016-11-29 DIAGNOSIS — M170 Bilateral primary osteoarthritis of knee: Secondary | ICD-10-CM | POA: Diagnosis not present

## 2016-11-29 DIAGNOSIS — M7121 Synovial cyst of popliteal space [Baker], right knee: Secondary | ICD-10-CM

## 2016-11-29 NOTE — Assessment & Plan Note (Signed)
Aspiration and injection. Return in a month.

## 2016-11-29 NOTE — Assessment & Plan Note (Signed)
At this point we have injected both knees, the right knee was done 2 months ago, overall doing well with regards to the patellofemoral, medial and lateral tibiofemoral joints. She does have some posterior pain consistent with a Baker's cyst, see above documentation and procedure note.

## 2016-11-29 NOTE — Progress Notes (Signed)
  Subjective:    CC: Follow-up  HPI: This is a pleasant 66 year old female with bilateral knee osteoarthritis and a right knee Baker's cyst on MRI, mild degenerative meniscal tearing.  We injected both of her knees, overall she is doing really well, she is having some increasing tightness and pain in the posterior medial aspect of her right knee.  Moderate, persistent without radiation, no mechanical symptoms.  Past medical history:  Negative.  See flowsheet/record as well for more information.  Surgical history: Negative.  See flowsheet/record as well for more information.  Family history: Negative.  See flowsheet/record as well for more information.  Social history: Negative.  See flowsheet/record as well for more information.  Allergies, and medications have been entered into the medical record, reviewed, and no changes needed.   Review of Systems: No fevers, chills, night sweats, weight loss, chest pain, or shortness of breath.   Objective:    General: Well Developed, well nourished, and in no acute distress.  Neuro: Alert and oriented x3, extra-ocular muscles intact, sensation grossly intact.  HEENT: Normocephalic, atraumatic, pupils equal round reactive to light, neck supple, no masses, no lymphadenopathy, thyroid nonpalpable.  Skin: Warm and dry, no rashes. Cardiac: Regular rate and rhythm, no murmurs rubs or gallops, no lower extremity edema.  Respiratory: Clear to auscultation bilaterally. Not using accessory muscles, speaking in full sentences. Right knee: Normal to inspection with no erythema or effusion or obvious bony abnormalities. Palpation normal with no warmth or joint line tenderness or patellar tenderness or condyle tenderness. Palpable and tender Baker's cyst at the crux of the medial head of the gastrocnemius and semimembranosus. ROM normal in flexion and extension and lower leg rotation. Ligaments with solid consistent endpoints including ACL, PCL, LCL,  MCL. Negative Mcmurray's and provocative meniscal tests. Non painful patellar compression. Patellar and quadriceps tendons unremarkable. Hamstring and quadriceps strength is normal.  Procedure: Real-time Ultrasound Guided aspiration/injection of right knee Baker's cyst Device: GE Logiq E  Verbal informed consent obtained.  Time-out conducted.  Noted no overlying erythema, induration, or other signs of local infection.  Skin prepped in a sterile fashion.  Local anesthesia: Topical Ethyl chloride.  With sterile technique and under real time ultrasound guidance: Using an 18-gauge needle I aspirated approximately 10 cc straw-colored fluid, syringe switched in 1 cc Kenalog 40, 1 cc lidocaine injected easily. Completed without difficulty  Pain immediately resolved suggesting accurate placement of the medication.  Advised to call if fevers/chills, erythema, induration, drainage, or persistent bleeding.  Images permanently stored and available for review in the ultrasound unit.  Impression: Technically successful ultrasound guided injection.  Impression and Recommendations:    Baker's cyst of knee, right Aspiration and injection. Return in a month.  Primary osteoarthritis of both knees At this point we have injected both knees, the right knee was done 2 months ago, overall doing well with regards to the patellofemoral, medial and lateral tibiofemoral joints. She does have some posterior pain consistent with a Baker's cyst, see above documentation and procedure note.  ___________________________________________ Gwen Her. Dianah Field, M.D., ABFM., CAQSM. Primary Care and [place] Instructor of [place] [employer] [facility] of Medicine

## 2016-12-03 DIAGNOSIS — M25561 Pain in right knee: Secondary | ICD-10-CM | POA: Diagnosis not present

## 2016-12-03 DIAGNOSIS — R262 Difficulty in walking, not elsewhere classified: Secondary | ICD-10-CM | POA: Diagnosis not present

## 2016-12-03 DIAGNOSIS — M6281 Muscle weakness (generalized): Secondary | ICD-10-CM | POA: Diagnosis not present

## 2016-12-03 DIAGNOSIS — M25562 Pain in left knee: Secondary | ICD-10-CM | POA: Diagnosis not present

## 2016-12-05 DIAGNOSIS — M25561 Pain in right knee: Secondary | ICD-10-CM | POA: Diagnosis not present

## 2016-12-05 DIAGNOSIS — M6281 Muscle weakness (generalized): Secondary | ICD-10-CM | POA: Diagnosis not present

## 2016-12-05 DIAGNOSIS — R262 Difficulty in walking, not elsewhere classified: Secondary | ICD-10-CM | POA: Diagnosis not present

## 2016-12-05 DIAGNOSIS — M25562 Pain in left knee: Secondary | ICD-10-CM | POA: Diagnosis not present

## 2016-12-12 DIAGNOSIS — M25562 Pain in left knee: Secondary | ICD-10-CM | POA: Diagnosis not present

## 2016-12-12 DIAGNOSIS — M6281 Muscle weakness (generalized): Secondary | ICD-10-CM | POA: Diagnosis not present

## 2016-12-12 DIAGNOSIS — R262 Difficulty in walking, not elsewhere classified: Secondary | ICD-10-CM | POA: Diagnosis not present

## 2016-12-12 DIAGNOSIS — M25561 Pain in right knee: Secondary | ICD-10-CM | POA: Diagnosis not present

## 2016-12-18 DIAGNOSIS — M25561 Pain in right knee: Secondary | ICD-10-CM | POA: Diagnosis not present

## 2016-12-18 DIAGNOSIS — R262 Difficulty in walking, not elsewhere classified: Secondary | ICD-10-CM | POA: Diagnosis not present

## 2016-12-18 DIAGNOSIS — M25562 Pain in left knee: Secondary | ICD-10-CM | POA: Diagnosis not present

## 2016-12-18 DIAGNOSIS — M6281 Muscle weakness (generalized): Secondary | ICD-10-CM | POA: Diagnosis not present

## 2016-12-27 ENCOUNTER — Ambulatory Visit (INDEPENDENT_AMBULATORY_CARE_PROVIDER_SITE_OTHER): Payer: BLUE CROSS/BLUE SHIELD | Admitting: Sports Medicine

## 2016-12-27 ENCOUNTER — Encounter: Payer: Self-pay | Admitting: Sports Medicine

## 2016-12-27 DIAGNOSIS — M7121 Synovial cyst of popliteal space [Baker], right knee: Secondary | ICD-10-CM | POA: Diagnosis not present

## 2016-12-27 DIAGNOSIS — M170 Bilateral primary osteoarthritis of knee: Secondary | ICD-10-CM | POA: Diagnosis not present

## 2016-12-27 NOTE — Assessment & Plan Note (Signed)
Repeat aspiration and injection, strapped with compressive dressing. Return in 6 weeks. If recurrence of symptoms we will send her for knee arthroscopy.

## 2016-12-27 NOTE — Progress Notes (Signed)
Subjective:    CC: Follow-up  HPI: This is a pleasant 66 year old female, we did an injection and aspiration of a right knee Baker's cyst, she did extremely well, she does have a slight recurrence, and a large trip coming up for Thanksgiving.  Symptoms are moderate, persistent, localized without radiation.  Past medical history:  Negative.  See flowsheet/record as well for more information.  Surgical history: Negative.  See flowsheet/record as well for more information.  Family history: Negative.  See flowsheet/record as well for more information.  Social history: Negative.  See flowsheet/record as well for more information.  Allergies, and medications have been entered into the medical record, reviewed, and no changes needed.   Review of Systems: No fevers, chills, night sweats, weight loss, chest pain, or shortness of breath.   Objective:    General: Well Developed, well nourished, and in no acute distress.  Neuro: Alert and oriented x3, extra-ocular muscles intact, sensation grossly intact.  HEENT: Normocephalic, atraumatic, pupils equal round reactive to light, neck supple, no masses, no lymphadenopathy, thyroid nonpalpable.  Skin: Warm and dry, no rashes. Cardiac: Regular rate and rhythm, no murmurs rubs or gallops, no lower extremity edema.  Respiratory: Clear to auscultation bilaterally. Not using accessory muscles, speaking in full sentences. Right knee: Slight swelling, palpable fluid wave and effusion, there is also a palpable swelling at the crux of the gastrocnemius and the semitendinosus consistent with recurrence of the Baker's cyst. ROM normal in flexion and extension and lower leg rotation. Ligaments with solid consistent endpoints including ACL, PCL, LCL, MCL. Negative Mcmurray's and provocative meniscal tests. Non painful patellar compression. Patellar and quadriceps tendons unremarkable. Hamstring and quadriceps strength is normal.  Procedure: Real-time Ultrasound  Guided aspiration/injection of right knee joint Device: GE Logiq E  Verbal informed consent obtained.  Time-out conducted.  Noted no overlying erythema, induration, or other signs of local infection.  Skin prepped in a sterile fashion.  Local anesthesia: Topical Ethyl chloride.  With sterile technique and under real time ultrasound guidance: 18-gauge needle advanced into the suprapatellar recess, I aspirated 24 mL of clear, straw-colored fluid, syringe switched and 1/2 cc Depo-Medrol 80 and 1/2 cc Marcaine injected into the knee joint. Completed without difficulty  Pain immediately resolved suggesting accurate placement of the medication.  Advised to call if fevers/chills, erythema, induration, drainage, or persistent bleeding.  Images permanently stored and available for review in the ultrasound unit.  Impression: Technically successful ultrasound guided injection.  Procedure: Real-time Ultrasound Guided aspiration of right knee Baker's cyst, injection. Device: GE Logiq E  Verbal informed consent obtained.  Time-out conducted.  Noted no overlying erythema, induration, or other signs of local infection.  Skin prepped in a sterile fashion.  Local anesthesia: Topical Ethyl chloride.  With sterile technique and under real time ultrasound guidance: The patient was flipped over from the knee injection, I noted the hypoechoic Baker's cyst at the crux of the semitendinosus and the gastrocnemius tendons, I advanced an 18-gauge needle into this, aspirated 4 mL of clear, straw-colored fluid, syringe switched and 1/2 cc Depo-Medrol 80 and 1/2 cc bupivacaine injected easily. Completed without difficulty  Pain immediately resolved suggesting accurate placement of the medication.  Advised to call if fevers/chills, erythema, induration, drainage, or persistent bleeding.  Images permanently stored and available for review in the ultrasound unit.  Impression: Technically successful ultrasound guided  injection.  The knee was then strapped with a compressive dressing.  Impression and Recommendations:    Primary osteoarthritis of both knees  Aspiration, Depo-Medrol injection into the right knee. It is a bit early but she does have a big trip coming up, return in 6 weeks.  Baker's cyst of knee, right Repeat aspiration and injection, strapped with compressive dressing. Return in 6 weeks. If recurrence of symptoms we will send her for knee arthroscopy.  ___________________________________________ Gwen Her. Dianah Field, M.D., ABFM., CAQSM. Primary Care and [place] Instructor of [place] of [facility] of Medicine

## 2016-12-27 NOTE — Assessment & Plan Note (Signed)
Aspiration, Depo-Medrol injection into the right knee. It is a bit early but she does have a big trip coming up, return in 6 weeks.

## 2017-01-31 ENCOUNTER — Encounter: Payer: Self-pay | Admitting: Sports Medicine

## 2017-01-31 ENCOUNTER — Ambulatory Visit (INDEPENDENT_AMBULATORY_CARE_PROVIDER_SITE_OTHER): Payer: BLUE CROSS/BLUE SHIELD | Admitting: Sports Medicine

## 2017-01-31 DIAGNOSIS — M170 Bilateral primary osteoarthritis of knee: Secondary | ICD-10-CM

## 2017-01-31 MED ORDER — TRAMADOL HCL 50 MG PO TABS: 50.0000 mg | ORAL_TABLET | Freq: Three times a day (TID) | ORAL | 1 refills | Status: DC | PRN

## 2017-01-31 NOTE — Assessment & Plan Note (Signed)
Left knee continues to do extremely well after injection. A month ago I aspirated and injected her right knee, we used Depo-Medrol, we also aspirated and injected a Baker's cyst. Because she has had a couple of recurrences I would like her to touch base with Dr. Ophelia Charter with [facility] for consideration of knee arthroscopy on the right. After her arthroscopy I would like her to return and we will proceed with Visco supplementation, I am going to go ahead and work on getting her approved. Tramadol in the meantime.

## 2017-01-31 NOTE — Progress Notes (Signed)
  Subjective:    CC: Right knee pain  HPI: Monique Reynolds is a pleasant 66 year old female, she has known bilateral knee osteoarthritis, the left knee responded well for years after injection, right knee has had several recurrences of pain, swelling, we have done a couple of aspiration and injection procedures for both her knee as well as a Baker's cyst.  Overall she is doing okay but still gets significant pain with mild exertion.  No overt mechanical symptoms but she is agreeable to proceed with arthroscopy.  Past medical history:  Negative.  See flowsheet/record as well for more information.  Surgical history: Negative.  See flowsheet/record as well for more information.  Family history: Negative.  See flowsheet/record as well for more information.  Social history: Negative.  See flowsheet/record as well for more information.  Allergies, and medications have been entered into the medical record, reviewed, and no changes needed.   (To billers/coders, pertinent past medical, social, surgical, family history can be found in problem list, if problem list is marked as reviewed then this indicates that past medical, social, surgical, family history was also reviewed)  Review of Systems: No fevers, chills, night sweats, weight loss, chest pain, or shortness of breath.   Objective:    General: Well Developed, well nourished, and in no acute distress.  Neuro: Alert and oriented x3, extra-ocular muscles intact, sensation grossly intact.  HEENT: Normocephalic, atraumatic, pupils equal round reactive to light, neck supple, no masses, no lymphadenopathy, thyroid nonpalpable.  Skin: Warm and dry, no rashes. Cardiac: Regular rate and rhythm, no murmurs rubs or gallops, no lower extremity edema.  Respiratory: Clear to auscultation bilaterally. Not using accessory muscles, speaking in full sentences.  ------------------------------------------------------------------------ Right knee MRI from [facility],  09/23/2016 IMPRESSION: 1. Small inferior articular surface tear involving the posterior horn of the medial meniscus near the meniscal root but no complete radial tear or medial protrusion of the meniscus. 2. Intact ligamentous structures and no acute bony findings. 3. Tricompartmental degenerative changes. 4. Small joint effusion and mild synovitis.Small Baker's cyst. 5. Proximal patellar tendinopathy. ------------------------------------------------------------------------  Impression and Recommendations:    Primary osteoarthritis of both knees Left knee continues to do extremely well after injection. A month ago I aspirated and injected her right knee, we used Depo-Medrol, we also aspirated and injected a Baker's cyst. Because she has had a couple of recurrences I would like her to touch base with Dr. Ophelia Charter with [facility] for consideration of knee arthroscopy on the right. After her arthroscopy I would like her to return and we will proceed with Visco supplementation, I am going to go ahead and work on getting her approved. Tramadol in the meantime.  I spent 25 minutes with this patient, greater than 50% was face-to-face time counseling regarding the above diagnoses ___________________________________________ Gwen Her. Dianah Field, M.D., ABFM., CAQSM. Primary Care and [place] Instructor of [place] of [facility] of Medicine

## 2017-02-07 DIAGNOSIS — D122 Benign neoplasm of ascending colon: Secondary | ICD-10-CM | POA: Diagnosis not present

## 2017-02-07 DIAGNOSIS — Z8371 Family history of colonic polyps: Secondary | ICD-10-CM | POA: Diagnosis not present

## 2017-02-07 DIAGNOSIS — Z800 Family history of malignant neoplasm of digestive organs: Secondary | ICD-10-CM | POA: Diagnosis not present

## 2017-02-07 DIAGNOSIS — K640 First degree hemorrhoids: Secondary | ICD-10-CM | POA: Diagnosis not present

## 2017-02-07 DIAGNOSIS — D123 Benign neoplasm of transverse colon: Secondary | ICD-10-CM | POA: Diagnosis not present

## 2017-02-07 DIAGNOSIS — D125 Benign neoplasm of sigmoid colon: Secondary | ICD-10-CM | POA: Diagnosis not present

## 2017-02-07 DIAGNOSIS — Z1211 Encounter for screening for malignant neoplasm of colon: Secondary | ICD-10-CM | POA: Diagnosis not present

## 2017-02-11 DIAGNOSIS — J019 Acute sinusitis, unspecified: Secondary | ICD-10-CM | POA: Diagnosis not present

## 2017-02-11 DIAGNOSIS — H04122 Dry eye syndrome of left lacrimal gland: Secondary | ICD-10-CM | POA: Diagnosis not present

## 2017-02-11 DIAGNOSIS — H5203 Hypermetropia, bilateral: Secondary | ICD-10-CM | POA: Diagnosis not present

## 2017-02-11 DIAGNOSIS — H11151 Pinguecula, right eye: Secondary | ICD-10-CM | POA: Diagnosis not present

## 2017-02-11 DIAGNOSIS — H524 Presbyopia: Secondary | ICD-10-CM | POA: Diagnosis not present

## 2017-02-15 ENCOUNTER — Telehealth: Payer: Self-pay | Admitting: Sports Medicine

## 2017-02-15 NOTE — Telephone Encounter (Signed)
-----   Message from Silverio Decamp, MD sent at 01/31/2017 10:01 AM EST ----- Orthovisc approval please boogs, XR confirmed, right knee only, failed stuff. ___________________________________________ Gwen Her. Dianah Field, M.D., ABFM., CAQSM. Primary Care and [place] Instructor of [place] of [facility] of Medicine

## 2017-02-15 NOTE — Telephone Encounter (Signed)
Submitted for approval on Orthovisc. Awaiting confirmation.  

## 2017-04-17 DIAGNOSIS — M170 Bilateral primary osteoarthritis of knee: Secondary | ICD-10-CM | POA: Diagnosis not present

## 2017-04-17 DIAGNOSIS — M7051 Other bursitis of knee, right knee: Secondary | ICD-10-CM | POA: Diagnosis not present

## 2017-04-17 DIAGNOSIS — E119 Type 2 diabetes mellitus without complications: Secondary | ICD-10-CM | POA: Diagnosis not present

## 2017-05-22 DIAGNOSIS — R300 Dysuria: Secondary | ICD-10-CM | POA: Diagnosis not present

## 2017-05-22 DIAGNOSIS — R350 Frequency of micturition: Secondary | ICD-10-CM | POA: Diagnosis not present

## 2017-06-03 DIAGNOSIS — J069 Acute upper respiratory infection, unspecified: Secondary | ICD-10-CM | POA: Diagnosis not present

## 2017-06-03 DIAGNOSIS — E119 Type 2 diabetes mellitus without complications: Secondary | ICD-10-CM | POA: Diagnosis not present

## 2017-06-24 DIAGNOSIS — Z1231 Encounter for screening mammogram for malignant neoplasm of breast: Secondary | ICD-10-CM | POA: Diagnosis not present

## 2017-06-24 DIAGNOSIS — Z803 Family history of malignant neoplasm of breast: Secondary | ICD-10-CM | POA: Diagnosis not present

## 2017-08-02 DIAGNOSIS — M1711 Unilateral primary osteoarthritis, right knee: Secondary | ICD-10-CM | POA: Diagnosis not present

## 2017-08-05 DIAGNOSIS — H25013 Cortical age-related cataract, bilateral: Secondary | ICD-10-CM | POA: Diagnosis not present

## 2017-08-05 DIAGNOSIS — H2513 Age-related nuclear cataract, bilateral: Secondary | ICD-10-CM | POA: Diagnosis not present

## 2017-08-05 DIAGNOSIS — Z7984 Long term (current) use of oral hypoglycemic drugs: Secondary | ICD-10-CM | POA: Diagnosis not present

## 2017-08-05 DIAGNOSIS — E119 Type 2 diabetes mellitus without complications: Secondary | ICD-10-CM | POA: Diagnosis not present

## 2018-04-25 ENCOUNTER — Encounter (HOSPITAL_BASED_OUTPATIENT_CLINIC_OR_DEPARTMENT_OTHER): Payer: Self-pay

## 2018-04-25 ENCOUNTER — Emergency Department (HOSPITAL_BASED_OUTPATIENT_CLINIC_OR_DEPARTMENT_OTHER)
Admission: EM | Admit: 2018-04-25 | Discharge: 2018-04-25 | Disposition: A | Discharge status: Home / Self Care | Payer: 59 | Attending: Emergency Medicine | Admitting: Emergency Medicine

## 2018-04-25 ENCOUNTER — Other Ambulatory Visit: Payer: Self-pay

## 2018-04-25 DIAGNOSIS — Z23 Encounter for immunization: Secondary | ICD-10-CM | POA: Diagnosis present

## 2018-04-25 DIAGNOSIS — T148XXA Other injury of unspecified body region, initial encounter: Secondary | ICD-10-CM

## 2018-04-25 DIAGNOSIS — E119 Type 2 diabetes mellitus without complications: Secondary | ICD-10-CM | POA: Diagnosis not present

## 2018-04-25 DIAGNOSIS — W540XXA Bitten by dog, initial encounter: Secondary | ICD-10-CM | POA: Diagnosis not present

## 2018-04-25 DIAGNOSIS — Z79899 Other long term (current) drug therapy: Secondary | ICD-10-CM | POA: Insufficient documentation

## 2018-04-25 DIAGNOSIS — I10 Essential (primary) hypertension: Secondary | ICD-10-CM | POA: Insufficient documentation

## 2018-04-25 DIAGNOSIS — Z2914 Encounter for prophylactic rabies immune globin: Secondary | ICD-10-CM | POA: Insufficient documentation

## 2018-04-25 DIAGNOSIS — Z87891 Personal history of nicotine dependence: Secondary | ICD-10-CM | POA: Diagnosis not present

## 2018-04-25 MED ORDER — RABIES IMMUNE GLOBULIN 150 UNIT/ML IM INJ
20.0000 [IU]/kg | INJECTION | Freq: Once | INTRAMUSCULAR | Status: AC
  Administered 2018-04-25: 1725 [IU] via INTRAMUSCULAR
  Filled 2018-04-25: qty 12

## 2018-04-25 MED ORDER — RABIES VACCINE, PCEC IM SUSR
1.0000 mL | Freq: Once | INTRAMUSCULAR | Status: AC
  Administered 2018-04-25: 1 mL via INTRAMUSCULAR
  Filled 2018-04-25: qty 1

## 2018-04-25 MED ORDER — TRAMADOL HCL 50 MG PO TABS
50.0000 mg | ORAL_TABLET | Freq: Once | ORAL | Status: AC
  Administered 2018-04-25: 50 mg via ORAL
  Filled 2018-04-25: qty 1

## 2018-04-25 NOTE — ED Notes (Signed)
                                  RABIES VACCINE FOLLOW UP  Patient's Name: Monique Reynolds                     Original Order Date:04/25/2018  Medical Record Number: [id number]  ED Physician: Malvin Johns, MD Primary Diagnosis: Rabies Exposure       PCP: Shelly Bombard, MD  Patient Phone Number: (home) [phone] (home)    (cell)  Telephone Information:  Mobile [phone]    (work) There is no work IT consultant. Species of Animal:     You have been seen in the Emergency Department for a possible rabies exposure. It's very important you return for the additional vaccine doses.  Please call the clinic listed below for hours of operation.   Clinic that will administer your rabies vaccines:    DAY 0:  04/25/2018      DAY 3:  04/28/2018       DAY 7:  05/02/2018     DAY 14:  05/09/2018         The 5th vaccine injection is considered for immune compromised patients only.  DAY 28:  05/23/2018

## 2018-04-25 NOTE — ED Triage Notes (Signed)
Pt reports dog bite to both hands ~ 530pm yesterday-was seen at [facility] called [facility] called pt and was told due to the dog was shot in the head they were unable to test for rabies-pt was advised "to go any ED"-NAD-steady gait

## 2018-04-25 NOTE — ED Provider Notes (Signed)
[facility] Provider Note   CSN: [id number] Arrival date & time: 04/25/18  1954    History   Chief Complaint Animal bite   HPI Monique Reynolds is a 68 y.o. female with past medical history significant for diabetes, hypertension, sleep apnea who presents for evaluation of animal bite.  Patient states she was bit by a family members people yesterday evening.  Was seen at [facility] at that time.  Patient had imaging done as well as given antibiotics and updated tetanus at that time.  Was not given her rabies series as family stated and was up-to-date on vaccines and was going to be picked up by animal control to be observed for 10 days.  Animal control called patient this afternoon and stated animal had been "shot by my son-in-law."  Animals remains were not able to be obtained by animal control not be tested for rabies.  She was recommended to come to the emergency department for evaluation.  Patient states she has been compliant with her Augmentin.  She has no redness or warmth to her wounds.  She does have tenderness over her bilateral upper extremities where the wounds are present.  She does have some mild swelling without any induration or fluctuance.  Denies Fever, chills, nausea, vomiting, chest pain, shortness of breath, decreased range of motion, numbness or tingling in her extremities.  Denies any alleviating or aggravating factors.  Is not take anything for pain PTA.  She rates her pain a 6/10.  History obtained from patient.  No interpreter was used.     HPI  Past Medical History:  Diagnosis Date  . Atypical ductal hyperplasia of breast - right  10/04/2011  . Depression   . Diabetes mellitus   . Family history of anesthesia complication    mother and daughter take along time to wake up  . Hypertension   . Sleep apnea    mild-no cpap recomended  . Torn meniscus     Patient Active Problem List   Diagnosis Date Noted  . Baker's cyst  of knee, right 11/29/2016  . Primary osteoarthritis of both knees 09/28/2016  . Symptomatic menopausal or female climacteric states 04/14/2013  . Atypical ductal hyperplasia of breast - right  10/04/2011    Past Surgical History:  Procedure Laterality Date  . BREAST SURGERY  10/29/11   Rt breast lumpectomy  . FINGER SURGERY  2004  . NEVUS EXCISION     vulvar rt   . TONSILLECTOMY  1956  . WISDOM TOOTH EXTRACTION       OB History   No obstetric history on file.      Home Medications    Prior to Admission medications   Medication Sig Start Date End Date Taking? Authorizing Provider  aspirin 81 MG tablet Take 81 mg by mouth daily.    [provider]  citalopram (CELEXA) 20 MG tablet Take 20 mg by mouth at bedtime.     [provider]  co-enzyme Q-10 30 MG capsule Take 30 mg by mouth 3 (three) times daily.    [provider]  Exenatide ER (BYDUREON) 2 MG PEN Inject into the skin.    [provider]  fish oil-omega-3 fatty acids 1000 MG capsule Take 2 g by mouth daily.    [provider]  Levothyroxine Sodium (SYNTHROID PO) Take 0.25 mg by mouth.    [provider]  lisinopril (PRINIVIL,ZESTRIL) 5 MG tablet Take 5 mg by mouth  every evening.    [provider]  meloxicam (MOBIC) 15 MG tablet One tab PO qAM with breakfast for 2 weeks, then daily prn pain. 09/28/16   Silverio Decamp, MD  metFORMIN (GLUCOPHAGE) 500 MG tablet Take 500 mg by mouth every evening. To equal 1500 mg in evening    [provider]  Multiple Vitamin (MULTIVITAMIN) tablet Take 1 tablet by mouth daily.    [provider]  simvastatin (ZOCOR) 40 MG tablet Take 40 mg by mouth every evening.    [provider]  traMADol (ULTRAM) 50 MG tablet Take 1 tablet (50 mg total) by mouth every 8 (eight) hours as needed. Chronic pain rx 01/31/17   Silverio Decamp, MD    Family History Family History  Problem Relation Age of  Onset  . Cancer Mother        breast & Ovarian & melanoma  . Heart disease Father   . Hypertension Father   . Cancer Paternal Aunt        breast  . Stroke Maternal Grandmother   . Stroke Paternal Grandmother   . Cancer Cousin        colon    Social History Social History   Tobacco Use  . Smoking status: Former Smoker    Last attempt to quit: 02/12/2006    Years since quitting: 12.2  . Smokeless tobacco: Never Used  Substance Use Topics  . Alcohol use: Yes    Alcohol/week: 0.0 standard drinks    Comment: socially  . Drug use: No     Allergies   Compazine [prochlorperazine edisylate]   Review of Systems Review of Systems  Constitutional: Negative.   HENT: Negative.   Respiratory: Negative.   Cardiovascular: Negative.   Gastrointestinal: Negative.   Genitourinary: Negative.   Musculoskeletal: Negative.   Skin: Positive for wound.  All other systems reviewed and are negative.    Physical Exam Updated Vital Signs BP (!) 108/58 (BP Location: Left Arm)   Pulse 82   Temp 98.2 F (36.8 C) (Oral)   Resp 18   Ht 5\' 1"  (1.549 m)   Wt 85 kg   SpO2 100%   BMI 35.39 kg/m   Physical Exam Vitals signs and nursing note reviewed.  Constitutional:      General: She is not in acute distress.    Appearance: She is well-developed. She is not ill-appearing, toxic-appearing or diaphoretic.  HENT:     Head: Normocephalic and atraumatic.     Nose: Nose normal.     Mouth/Throat:     Mouth: Mucous membranes are moist.  Eyes:     Pupils: Pupils are equal, round, and reactive to light.  Neck:     Musculoskeletal: Normal range of motion.  Cardiovascular:     Rate and Rhythm: Normal rate.  Pulmonary:     Effort: No respiratory distress.     Breath sounds: Normal breath sounds.  Abdominal:     General: Bowel sounds are normal. There is no distension.  Musculoskeletal: Normal range of motion.     Comments: Full range motion bilateral upper extremities without difficulty.   No edema or obvious deformity.  Skin:    General: Skin is warm and dry.     Comments: Patient with multiple foot wounds to bilateral upper extremities, more so to distal aspects of hands.  No erythema, induration or fluctuance.  No warmth.  Capillary refill risk.  2+ radial pulses bilaterally.  Neurological:  Mental Status: She is alert.     Comments: Intact sensation to bilateral upper extremity.          ED Treatments / Results  Labs (all labs ordered are listed, but only abnormal results are displayed) Labs Reviewed - No data to display  EKG None  Radiology No results found.  Procedures Procedures (including critical care time)  Medications Ordered in ED Medications  rabies immune globulin (HYPERAB/KEDRAB) injection 1,725 Units (1,725 Units Intramuscular Given 04/25/18 2115)  rabies vaccine (RABAVERT) injection 1 mL (1 mL Intramuscular Given 04/25/18 2111)  traMADol (ULTRAM) tablet 50 mg (50 mg Oral Given 04/25/18 2110)     Initial Impression / Assessment and Plan / ED Course  I have reviewed the triage vital signs and the nursing notes.  Pertinent labs & imaging results that were available during my care of the patient were reviewed by me and considered in my medical decision making (see chart for details).  68 year old female appears otherwise well presents for evaluation of animal bites.  Afebrile, nonseptic, non-ill-appearing.  Seen by [facility] yesterday had updated tetanus, prescribed Augmentin as well as had imaging performed.  Did not have rabies series given at time.  Patient presents today for rabies vaccines.  No evidence of acute infectious process to wounds.  They are scabbed over at this time.  No erythema, warmth, induration or fluctuance.  Normal musculoskeletal exam.  Neurovascularly intact.  Upper extremity compartments are soft.  Will give patient rabies vaccine as well as follow-up for rabies series.  Patient hemodynamically stable and  appropriate for DC home at this time.  I have discussed return precautions with patient.  Patient voiced understanding and is agreeable to follow-up.     Final Clinical Impressions(s) / ED Diagnoses   Final diagnoses:  Animal bite  Need for immunization against rabies    ED Discharge Orders    None       ,  A, PA-C 04/25/18 2139    Malvin Johns, MD 04/25/18 2239

## 2018-04-25 NOTE — Discharge Instructions (Signed)
Evaluated today for rabies series.  You are given your first shot here.  Please follow the paper attached with your discharge instructions for your additional vaccines.  Please continue to take the Augmentin you are previously prescribed.  If you notice any swelling, increased redness, streaking up the arm please seek reevaluation.  Return to the ED for any new or worsening symptoms.

## 2018-04-28 ENCOUNTER — Encounter (HOSPITAL_COMMUNITY): Payer: Self-pay | Admitting: Emergency Medicine

## 2018-04-28 ENCOUNTER — Ambulatory Visit (HOSPITAL_COMMUNITY)
Admission: EM | Admit: 2018-04-28 | Discharge: 2018-04-28 | Disposition: A | Discharge status: Home / Self Care | Payer: 59 | Attending: Family Medicine | Admitting: Family Medicine

## 2018-04-28 DIAGNOSIS — Z23 Encounter for immunization: Secondary | ICD-10-CM

## 2018-04-28 DIAGNOSIS — Z203 Contact with and (suspected) exposure to rabies: Secondary | ICD-10-CM

## 2018-04-28 MED ORDER — RABIES VACCINE, PCEC IM SUSR
INTRAMUSCULAR | Status: AC
  Filled 2018-04-28: qty 1

## 2018-04-28 MED ORDER — RABIES VACCINE, PCEC IM SUSR
1.0000 mL | Freq: Once | INTRAMUSCULAR | Status: AC
  Administered 2018-04-28: 1 mL via INTRAMUSCULAR

## 2018-04-28 NOTE — ED Triage Notes (Signed)
Pt here for day 3 rabies injection; given in left arm

## 2018-05-02 ENCOUNTER — Encounter (HOSPITAL_COMMUNITY): Payer: Self-pay | Admitting: Emergency Medicine

## 2018-05-02 ENCOUNTER — Ambulatory Visit (HOSPITAL_COMMUNITY)
Admission: EM | Admit: 2018-05-02 | Discharge: 2018-05-02 | Disposition: A | Discharge status: Home / Self Care | Payer: 59 | Attending: Family Medicine | Admitting: Family Medicine

## 2018-05-02 ENCOUNTER — Other Ambulatory Visit: Payer: Self-pay

## 2018-05-02 DIAGNOSIS — Z203 Contact with and (suspected) exposure to rabies: Secondary | ICD-10-CM

## 2018-05-02 DIAGNOSIS — Z23 Encounter for immunization: Secondary | ICD-10-CM | POA: Diagnosis not present

## 2018-05-02 MED ORDER — RABIES VACCINE, PCEC IM SUSR
INTRAMUSCULAR | Status: AC
  Filled 2018-05-02: qty 1

## 2018-05-02 MED ORDER — RABIES VACCINE, PCEC IM SUSR
1.0000 mL | Freq: Once | INTRAMUSCULAR | Status: AC
  Administered 2018-05-02: 1 mL via INTRAMUSCULAR

## 2018-05-02 NOTE — ED Notes (Signed)
Patient able to ambulate independently  

## 2018-05-02 NOTE — ED Triage Notes (Signed)
Pt presents to [facility] for third shot in rabies series.

## 2019-03-03 DIAGNOSIS — B029 Zoster without complications: Secondary | ICD-10-CM | POA: Diagnosis not present

## 2019-03-04 DIAGNOSIS — L308 Other specified dermatitis: Secondary | ICD-10-CM | POA: Diagnosis not present

## 2019-03-04 DIAGNOSIS — B028 Zoster with other complications: Secondary | ICD-10-CM | POA: Diagnosis not present

## 2019-03-06 DIAGNOSIS — B029 Zoster without complications: Secondary | ICD-10-CM | POA: Diagnosis not present

## 2019-03-06 DIAGNOSIS — K1120 Sialoadenitis, unspecified: Secondary | ICD-10-CM | POA: Diagnosis not present

## 2019-03-26 DIAGNOSIS — E119 Type 2 diabetes mellitus without complications: Secondary | ICD-10-CM | POA: Diagnosis not present

## 2019-03-26 DIAGNOSIS — E782 Mixed hyperlipidemia: Secondary | ICD-10-CM | POA: Diagnosis not present

## 2019-03-26 DIAGNOSIS — M1711 Unilateral primary osteoarthritis, right knee: Secondary | ICD-10-CM | POA: Diagnosis not present

## 2019-03-26 DIAGNOSIS — I10 Essential (primary) hypertension: Secondary | ICD-10-CM | POA: Diagnosis not present

## 2019-03-26 DIAGNOSIS — M7051 Other bursitis of knee, right knee: Secondary | ICD-10-CM | POA: Diagnosis not present

## 2019-03-26 DIAGNOSIS — E039 Hypothyroidism, unspecified: Secondary | ICD-10-CM | POA: Diagnosis not present

## 2019-04-17 DIAGNOSIS — Z23 Encounter for immunization: Secondary | ICD-10-CM | POA: Diagnosis not present

## 2019-04-23 DIAGNOSIS — Z13820 Encounter for screening for osteoporosis: Secondary | ICD-10-CM | POA: Diagnosis not present

## 2019-04-23 DIAGNOSIS — Z780 Asymptomatic menopausal state: Secondary | ICD-10-CM | POA: Diagnosis not present

## 2019-05-15 DIAGNOSIS — Z23 Encounter for immunization: Secondary | ICD-10-CM | POA: Diagnosis not present

## 2019-05-19 ENCOUNTER — Ambulatory Visit (INDEPENDENT_AMBULATORY_CARE_PROVIDER_SITE_OTHER): Payer: BC Managed Care – PPO | Admitting: Sports Medicine

## 2019-05-19 ENCOUNTER — Other Ambulatory Visit: Payer: Self-pay

## 2019-05-19 DIAGNOSIS — M170 Bilateral primary osteoarthritis of knee: Secondary | ICD-10-CM

## 2019-05-19 MED ORDER — TRAMADOL HCL 50 MG PO TABS: 50.0000 mg | ORAL_TABLET | Freq: Three times a day (TID) | ORAL | 1 refills | Status: DC | PRN

## 2019-05-19 NOTE — Addendum Note (Signed)
Addended by: Silverio Decamp on: 05/19/2019 03:12 PM   Modules accepted: Orders

## 2019-05-19 NOTE — Assessment & Plan Note (Signed)
Monique Reynolds returns, she is a pleasant 69 year old female, we have not seen her for almost 2-1/2 years now. She had a Baker's cyst that we aspirated and injected with Depo-Medrol. She did well until recently, I would like to drain and inject her Baker's cyst again today but she just had her second COVID-19 vaccine. I like to see her back in just under 2 weeks before she goes on vacation to the mountains and we can perform the procedure at that time.

## 2019-05-19 NOTE — Progress Notes (Signed)
    Procedures performed today:    None.  Independent interpretation of notes and tests performed by another provider:   None.  Brief History, Exam, Impression, and Recommendations:    Primary osteoarthritis of both knees Monique Reynolds returns, she is a pleasant 69 year old female, we have not seen her for almost 2-1/2 years now. She had a Baker's cyst that we aspirated and injected with Depo-Medrol. She did well until recently, I would like to drain and inject her Baker's cyst again today but she just had her second COVID-19 vaccine. I like to see her back in just under 2 weeks before she goes on vacation to the mountains and we can perform the procedure at that time.    ___________________________________________ Gwen Her. Dianah Field, M.D., ABFM., CAQSM. Primary Care and [place] Instructor of [place] of [facility] of Medicine

## 2019-06-03 ENCOUNTER — Ambulatory Visit (INDEPENDENT_AMBULATORY_CARE_PROVIDER_SITE_OTHER): Payer: BC Managed Care – PPO | Admitting: Sports Medicine

## 2019-06-03 ENCOUNTER — Other Ambulatory Visit: Payer: Self-pay

## 2019-06-03 DIAGNOSIS — M7121 Synovial cyst of popliteal space [Baker], right knee: Secondary | ICD-10-CM

## 2019-06-03 NOTE — Assessment & Plan Note (Addendum)
Monique Reynolds returns, we last did an aspiration and injection of a Baker's cyst at the end of 2018. She is having recurrence of swelling, today I performed a repeat aspiration and injection. She does have a mountain trip coming up with some hiking, return to see me as needed.

## 2019-06-03 NOTE — Progress Notes (Signed)
    Procedures performed today:    Procedure: Real-time Ultrasound Guided  aspiration/injection of right knee Baker's cyst Device: Samsung HS60  Verbal informed consent obtained.  Time-out conducted.  Noted no overlying erythema, induration, or other signs of local infection.  Skin prepped in a sterile fashion.  Local anesthesia: Topical Ethyl chloride.  With sterile technique and under real time ultrasound guidance:  I advanced an 18-gauge needle into the Baker's cyst and aspirated 6 cc of clear, straw-colored fluid, syringe switched and 1 cc Kenalog 40 injected easily.   Completed without difficulty  Pain immediately resolved suggesting accurate placement of the medication.  Advised to call if fevers/chills, erythema, induration, drainage, or persistent bleeding.  Images permanently stored and available for review in the ultrasound unit.  Impression: Technically successful ultrasound guided injection.  Independent interpretation of notes and tests performed by another provider:   None.  Brief History, Exam, Impression, and Recommendations:    Baker's cyst of knee, right Monique Reynolds returns, we last did an aspiration and injection of a Baker's cyst at the end of 2018. She is having recurrence of swelling, today I performed a repeat aspiration and injection. She does have a mountain trip coming up with some hiking, return to see me as needed.    ___________________________________________ Gwen Her. Dianah Field, M.D., ABFM., CAQSM. Primary Care and [place] Instructor of [place] of [facility] of Medicine

## 2019-06-23 DIAGNOSIS — Z803 Family history of malignant neoplasm of breast: Secondary | ICD-10-CM | POA: Diagnosis not present

## 2019-06-23 DIAGNOSIS — F411 Generalized anxiety disorder: Secondary | ICD-10-CM | POA: Diagnosis not present

## 2019-06-23 DIAGNOSIS — E119 Type 2 diabetes mellitus without complications: Secondary | ICD-10-CM | POA: Diagnosis not present

## 2019-06-23 DIAGNOSIS — E039 Hypothyroidism, unspecified: Secondary | ICD-10-CM | POA: Diagnosis not present

## 2019-08-18 DIAGNOSIS — Z1231 Encounter for screening mammogram for malignant neoplasm of breast: Secondary | ICD-10-CM | POA: Diagnosis not present

## 2019-08-31 ENCOUNTER — Ambulatory Visit: Payer: BC Managed Care – PPO | Admitting: Sports Medicine

## 2019-08-31 ENCOUNTER — Other Ambulatory Visit: Payer: Self-pay

## 2019-08-31 ENCOUNTER — Encounter: Payer: Self-pay | Admitting: Sports Medicine

## 2019-08-31 ENCOUNTER — Other Ambulatory Visit: Payer: Self-pay | Admitting: Sports Medicine

## 2019-08-31 DIAGNOSIS — M7121 Synovial cyst of popliteal space [Baker], right knee: Secondary | ICD-10-CM

## 2019-08-31 DIAGNOSIS — M170 Bilateral primary osteoarthritis of knee: Secondary | ICD-10-CM

## 2019-08-31 DIAGNOSIS — M7061 Trochanteric bursitis, right hip: Secondary | ICD-10-CM | POA: Diagnosis not present

## 2019-08-31 MED ORDER — TRAMADOL HCL 50 MG PO TABS: 50.0000 mg | ORAL_TABLET | Freq: Three times a day (TID) | ORAL | 1 refills | Status: AC | PRN

## 2019-08-31 MED ORDER — CELECOXIB 100 MG PO CAPS: ORAL_CAPSULE | ORAL | 3 refills | Status: DC

## 2019-08-31 NOTE — Assessment & Plan Note (Signed)
Pain over the lateral hip with radiation down to the knee, no paresthesias, tenderness over the greater trochanter itself all consistent with greater trochanteric bursitis, adding Celebrex as she does get dyspepsia with other NSAIDs, concurrent bursitis rehab exercises, return to see me in a month for this, injection if no better.

## 2019-08-31 NOTE — Progress Notes (Signed)
    Procedures performed today:    Procedure: Real-time Ultrasound Guided aspiration/injection of right Baker's cyst Device: Samsung HS60  Verbal informed consent obtained.  Time-out conducted.  Noted no overlying erythema, induration, or other signs of local infection.  Skin prepped in a sterile fashion.  Local anesthesia: Topical Ethyl chloride.  With sterile technique and under real time ultrasound guidance:  Using 18-gauge needle aspirated approximately 3 mL of clear, straw-colored fluid, syringe switched and 1 cc Kenalog 40, 1 cc lidocaine. Completed without difficulty  Pain immediately resolved suggesting accurate placement of the medication.  Advised to call if fevers/chills, erythema, induration, drainage, or persistent bleeding.  Images permanently stored and available for review in the ultrasound unit.  Impression: Technically successful ultrasound guided injection.  Independent interpretation of notes and tests performed by another provider:   None.  Brief History, Exam, Impression, and Recommendations:    Baker's cyst of knee, right Repeat aspiration and injection of right knee Baker's cyst, last done about 3 months ago. Weeks plain the pathophysiology today, return as needed for this.  Trochanteric bursitis, right hip Pain over the lateral hip with radiation down to the knee, no paresthesias, tenderness over the greater trochanter itself all consistent with greater trochanteric bursitis, adding Celebrex as she does get dyspepsia with other NSAIDs, concurrent bursitis rehab exercises, return to see me in a month for this, injection if no better.    ___________________________________________ Gwen Her. Dianah Field, M.D., ABFM., CAQSM. Primary Care and [place] [employer] [facility] [employer] [facility] [employer]

## 2019-08-31 NOTE — Assessment & Plan Note (Signed)
Repeat aspiration and injection of right knee Baker's cyst, last done about 3 months ago. Weeks plain the pathophysiology today, return as needed for this.

## 2019-09-29 ENCOUNTER — Ambulatory Visit: Payer: BC Managed Care – PPO | Admitting: Sports Medicine

## 2019-10-08 ENCOUNTER — Ambulatory Visit: Payer: BC Managed Care – PPO | Admitting: Sports Medicine

## 2019-10-12 DIAGNOSIS — M170 Bilateral primary osteoarthritis of knee: Secondary | ICD-10-CM | POA: Diagnosis not present

## 2019-10-12 DIAGNOSIS — E119 Type 2 diabetes mellitus without complications: Secondary | ICD-10-CM | POA: Diagnosis not present

## 2020-01-09 ENCOUNTER — Other Ambulatory Visit: Payer: Self-pay | Admitting: Sports Medicine

## 2020-01-09 DIAGNOSIS — M7061 Trochanteric bursitis, right hip: Secondary | ICD-10-CM

## 2020-07-06 ENCOUNTER — Other Ambulatory Visit (HOSPITAL_COMMUNITY)
Admission: RE | Admit: 2020-07-06 | Discharge: 2020-07-06 | Disposition: A | Discharge status: Home / Self Care | Payer: BC Managed Care – PPO | Source: Ambulatory Visit | Attending: Obstetrics | Admitting: Obstetrics

## 2020-07-06 ENCOUNTER — Encounter: Payer: Self-pay | Admitting: Obstetrics

## 2020-07-06 ENCOUNTER — Ambulatory Visit (INDEPENDENT_AMBULATORY_CARE_PROVIDER_SITE_OTHER): Payer: BC Managed Care – PPO | Admitting: Obstetrics

## 2020-07-06 ENCOUNTER — Other Ambulatory Visit: Payer: Self-pay

## 2020-07-06 VITALS — BP 108/68 | HR 62 | Wt 164.0 lb

## 2020-07-06 DIAGNOSIS — Z01419 Encounter for gynecological examination (general) (routine) without abnormal findings: Secondary | ICD-10-CM | POA: Insufficient documentation

## 2020-07-06 NOTE — Progress Notes (Signed)
Subjective:        Monique Reynolds is a 70 y.o. female here for a routine exam.  Current complaints: None.    Personal health questionnaire:  Is patient Ashkenazi Jewish, have a family history of breast and/or ovarian cancer: no Is there a family history of uterine cancer diagnosed at age < 20, gastrointestinal cancer, urinary tract cancer, family member who is a Field seismologist syndrome-associated carrier: no Is the patient overweight and hypertensive, family history of diabetes, personal history of gestational diabetes, preeclampsia or PCOS: no Is patient over 96, have PCOS,  family history of premature CHD under age 15, diabetes, smoke, have hypertension or peripheral artery disease:  no At any time, has a partner hit, kicked or otherwise hurt or frightened you?: no Over the past 2 weeks, have you felt down, depressed or hopeless?: no Over the past 2 weeks, have you felt little interest or pleasure in doing things?:no   Gynecologic History No LMP recorded. Patient is postmenopausal. Contraception: post menopausal status Last Pap: 08-17-2014. Results were: normal Last mammogram: 2016. Results were: normal  Obstetric History OB History  No obstetric history on file.    Past Medical History:  Diagnosis Date  . Atypical ductal hyperplasia of breast - right  10/04/2011  . Depression   . Diabetes mellitus   . Family history of anesthesia complication    mother and daughter take along time to wake up  . Hypertension   . Sleep apnea    mild-no cpap recomended  . Torn meniscus     Past Surgical History:  Procedure Laterality Date  . BREAST SURGERY  10/29/11   Rt breast lumpectomy  . FINGER SURGERY  2004  . NEVUS EXCISION     vulvar rt   . TONSILLECTOMY  1956  . WISDOM TOOTH EXTRACTION       Current Outpatient Medications:  .  aspirin 81 MG tablet, Take 81 mg by mouth daily., Disp: , Rfl:  .  citalopram (CELEXA) 20 MG tablet, Take 20 mg by mouth at bedtime. , Disp: , Rfl:  .   fish oil-omega-3 fatty acids 1000 MG capsule, Take 2 g by mouth daily., Disp: , Rfl:  .  Levothyroxine Sodium (SYNTHROID PO), Take 0.25 mg by mouth., Disp: , Rfl:  .  lisinopril (PRINIVIL,ZESTRIL) 5 MG tablet, Take 10 mg by mouth every evening. , Disp: , Rfl:  .  metFORMIN (GLUCOPHAGE) 500 MG tablet, Take 1,000 mg by mouth 2 (two) times daily with a meal. To equal 1500 mg in evening, Disp: , Rfl:  .  Multiple Vitamin (MULTIVITAMIN) tablet, Take 1 tablet by mouth daily., Disp: , Rfl:  .  traMADol (ULTRAM) 50 MG tablet, Take 1 tablet (50 mg total) by mouth every 8 (eight) hours as needed. Chronic pain rx, Disp: 60 tablet, Rfl: 1 .  celecoxib (CELEBREX) 100 MG capsule, ONE TO 2 TABLETS BY MOUTH DAILY AS NEEDED FOR PAIN. NEEDS TO TAKE WITH FOOD. (Patient not taking: Reported on 07/06/2020), Disp: 60 capsule, Rfl: 3 .  co-enzyme Q-10 30 MG capsule, Take 30 mg by mouth 3 (three) times daily. (Patient not taking: Reported on 07/06/2020), Disp: , Rfl:  Allergies  Allergen Reactions  . Compazine [Prochlorperazine Edisylate]     Pt does not remember reaction    Social History   Tobacco Use  . Smoking status: Former Smoker    Quit date: 02/12/2006    Years since quitting: 14.4  . Smokeless tobacco: Never Used  Substance  Use Topics  . Alcohol use: Yes    Alcohol/week: 0.0 standard drinks    Comment: socially    Family History  Problem Relation Age of Onset  . Cancer Mother        breast & Ovarian & melanoma  . Heart disease Father   . Hypertension Father   . Cancer Paternal Aunt        breast  . Stroke Maternal Grandmother   . Stroke Paternal Grandmother   . Cancer Cousin        colon      Review of Systems  Constitutional: negative for fatigue and weight loss Respiratory: negative for cough and wheezing Cardiovascular: negative for chest pain, fatigue and palpitations Gastrointestinal: negative for abdominal pain and change in bowel habits Musculoskeletal:negative for  myalgias Neurological: negative for gait problems and tremors Behavioral/Psych: negative for abusive relationship, depression Endocrine: negative for temperature intolerance    Genitourinary:negative for abnormal menstrual periods, genital lesions, hot flashes, sexual problems and vaginal discharge Integument/breast: negative for breast lump, breast tenderness, nipple discharge and skin lesion(s)    Objective:       BP 108/68   Pulse 62   Wt 164 lb (74.4 kg)   BMI 30.99 kg/m  General:   alert and no distress  Skin:   no rash or abnormalities  Lungs:   clear to auscultation bilaterally  Heart:   regular rate and rhythm, S1, S2 normal, no murmur, click, rub or gallop  Breasts:   normal without suspicious masses, skin or nipple changes or axillary nodes  Abdomen:  normal findings: no organomegaly, soft, non-tender and no hernia  Pelvis:  External genitalia: normal general appearance Urinary system: urethral meatus normal and bladder without fullness, nontender Vaginal: normal without tenderness, induration or masses Cervix: normal appearance Adnexa: normal bimanual exam Uterus: anteverted and non-tender, normal size   Lab Review Urine pregnancy test Labs reviewed yes Radiologic studies reviewed yes  I have spent a total of 20 minutes of face-to-face time, excluding clinical staff time, reviewing notes and preparing to see patient, ordering tests and/or medications, and counseling the patient.  Assessment:     1. Encounter for gynecological examination with Papanicolaou smear of cervix Rx: - Cytology - PAP   Plan:    Education reviewed: calcium supplements, depression evaluation, low fat, low cholesterol diet, safe sex/STD prevention, self breast exams, skin cancer screening and weight bearing exercise. Follow up in: 1 year.    Shelly Bombard, MD 07/06/2020 12:45 PM

## 2020-07-06 NOTE — Progress Notes (Signed)
Pt presents for annual and pap.  Normal pap 08/17/2014

## 2020-07-07 LAB — CYTOLOGY - PAP: Diagnosis: NEGATIVE

## 2020-12-31 ENCOUNTER — Other Ambulatory Visit: Payer: Self-pay | Admitting: Obstetrics

## 2020-12-31 DIAGNOSIS — R928 Other abnormal and inconclusive findings on diagnostic imaging of breast: Secondary | ICD-10-CM

## 2021-01-11 ENCOUNTER — Other Ambulatory Visit: Payer: Self-pay | Admitting: Obstetrics

## 2021-01-11 DIAGNOSIS — N6320 Unspecified lump in the left breast, unspecified quadrant: Secondary | ICD-10-CM

## 2021-04-24 ENCOUNTER — Other Ambulatory Visit: Payer: Self-pay | Admitting: Sports Medicine

## 2021-04-24 DIAGNOSIS — M7061 Trochanteric bursitis, right hip: Secondary | ICD-10-CM

## 2021-04-25 NOTE — Telephone Encounter (Signed)
Called patient to see if she would like to schedule appt or she will need to get this prescription from her pcp. She was last seen July 2021 ?

## 2021-04-26 NOTE — Telephone Encounter (Signed)
Left msg for pt to return call regarding refill.  ?
